# Patient Record
Sex: Male | Born: 1960 | Race: White | Hispanic: No | Marital: Single
Health system: Midwestern US, Community
[De-identification: ages and names within clinical notes are randomized; demographics above are authoritative.]

## PROBLEM LIST (undated history)

## (undated) DIAGNOSIS — I25119 Atherosclerotic heart disease of native coronary artery with unspecified angina pectoris: Secondary | ICD-10-CM

## (undated) DIAGNOSIS — E119 Type 2 diabetes mellitus without complications: Secondary | ICD-10-CM

## (undated) DIAGNOSIS — F324 Major depressive disorder, single episode, in partial remission: Secondary | ICD-10-CM

## (undated) DIAGNOSIS — I10 Essential (primary) hypertension: Secondary | ICD-10-CM

## (undated) DIAGNOSIS — Z9151 Personal history of suicidal behavior: Secondary | ICD-10-CM

## (undated) DIAGNOSIS — E785 Hyperlipidemia, unspecified: Secondary | ICD-10-CM

## (undated) DIAGNOSIS — Z915 Personal history of self-harm: Secondary | ICD-10-CM

## (undated) DIAGNOSIS — Z9189 Other specified personal risk factors, not elsewhere classified: Secondary | ICD-10-CM

## (undated) HISTORY — PX: APPENDECTOMY: SHX54

## (undated) HISTORY — PX: KNEE SURGERY: SHX244

## (undated) HISTORY — PX: TOE AMPUTATION: SHX809

## (undated) HISTORY — PX: EYE SURGERY: SHX253

## (undated) HISTORY — PX: HAMMER TOE SURGERY: SHX385

## (undated) HISTORY — PX: ANKLE SURGERY: SHX546

---

## 2014-07-14 ENCOUNTER — Emergency Department (HOSPITAL_COMMUNITY)
Admission: EM | Admit: 2014-07-14 | Discharge: 2014-07-14 | Disposition: A | Discharge status: Home / Self Care | Payer: Self-pay | Attending: Emergency Medicine | Admitting: Emergency Medicine

## 2014-07-14 ENCOUNTER — Encounter (HOSPITAL_COMMUNITY): Payer: Self-pay | Admitting: Emergency Medicine

## 2014-07-14 DIAGNOSIS — I10 Essential (primary) hypertension: Secondary | ICD-10-CM | POA: Insufficient documentation

## 2014-07-14 DIAGNOSIS — Z880 Allergy status to penicillin: Secondary | ICD-10-CM | POA: Insufficient documentation

## 2014-07-14 DIAGNOSIS — L97529 Non-pressure chronic ulcer of other part of left foot with unspecified severity: Secondary | ICD-10-CM

## 2014-07-14 DIAGNOSIS — L97519 Non-pressure chronic ulcer of other part of right foot with unspecified severity: Secondary | ICD-10-CM | POA: Insufficient documentation

## 2014-07-14 DIAGNOSIS — E11621 Type 2 diabetes mellitus with foot ulcer: Secondary | ICD-10-CM

## 2014-07-14 HISTORY — DX: Hyperlipidemia, unspecified: E78.5

## 2014-07-14 HISTORY — DX: Type 2 diabetes mellitus without complications: E11.9

## 2014-07-14 HISTORY — DX: Essential (primary) hypertension: I10

## 2014-07-14 LAB — COMPREHENSIVE METABOLIC PANEL
ALT: 26 U/L (ref 17–63)
AST: 29 U/L (ref 15–41)
Albumin: 3.7 g/dL (ref 3.5–5.0)
Alkaline Phosphatase: 68 U/L (ref 38–126)
Anion gap: 11 (ref 5–15)
BUN: 18 mg/dL (ref 6–20)
CO2: 21 mmol/L — ABNORMAL LOW (ref 22–32)
Calcium: 9.2 mg/dL (ref 8.9–10.3)
Chloride: 108 mmol/L (ref 101–111)
Creatinine, Ser: 1.08 mg/dL (ref 0.61–1.24)
GFR calc Af Amer: >60 mL/min (ref >60)
GFR calc non Af Amer: >60 mL/min (ref >60)
Glucose, Bld: 112 mg/dL — ABNORMAL HIGH (ref 70–99)
Potassium: 4.4 mmol/L (ref 3.5–5.1)
Sodium: 140 mmol/L (ref 135–145)
Total Bilirubin: 0.6 mg/dL (ref 0.3–1.2)
Total Protein: 7.3 g/dL (ref 6.5–8.1)

## 2014-07-14 LAB — CBC WITH DIFFERENTIAL/PLATELET
Basophils Absolute: 0 10*3/uL (ref 0.0–0.1)
Basophils Relative: 0 % (ref 0–1)
Eosinophils Absolute: 0.2 10*3/uL (ref 0.0–0.7)
Eosinophils Relative: 2 % (ref 0–5)
HCT: 49.1 % (ref 39.0–52.0)
Hemoglobin: 16.3 g/dL (ref 13.0–17.0)
Lymphocytes Relative: 23 % (ref 12–46)
Lymphs Abs: 1.7 10*3/uL (ref 0.7–4.0)
MCH: 29.6 pg (ref 26.0–34.0)
MCHC: 33.2 g/dL (ref 30.0–36.0)
MCV: 89.3 fL (ref 78.0–100.0)
Monocytes Absolute: 0.6 10*3/uL (ref 0.1–1.0)
Monocytes Relative: 8 % (ref 3–12)
Neutro Abs: 4.9 10*3/uL (ref 1.7–7.7)
Neutrophils Relative %: 67 % (ref 43–77)
Platelets: 170 10*3/uL (ref 150–400)
RBC: 5.5 MIL/uL (ref 4.22–5.81)
RDW: 13.5 % (ref 11.5–15.5)
WBC: 7.4 10*3/uL (ref 4.0–10.5)

## 2014-07-14 LAB — I-STAT TROPONIN, ED: Troponin i, poc: 0.01 ng/mL (ref 0.00–0.08)

## 2014-07-14 MED ORDER — DOXYCYCLINE HYCLATE 100 MG PO CAPS: 100.0000 mg | ORAL_CAPSULE | Freq: Two times a day (BID) | ORAL | Status: DC

## 2014-07-14 MED ORDER — HYDROMORPHONE HCL 1 MG/ML IJ SOLN
1.0000 mg | Freq: Once | INTRAMUSCULAR | Status: AC
Start: 2014-07-14 — End: 2014-07-14
  Administered 2014-07-14: 1 mg via INTRAMUSCULAR
  Filled 2014-07-14: qty 1

## 2014-07-14 MED ORDER — DOXYCYCLINE HYCLATE 100 MG PO TABS
100.0000 mg | ORAL_TABLET | Freq: Once | ORAL | Status: AC
  Administered 2014-07-14: 100 mg via ORAL
  Filled 2014-07-14: qty 1

## 2014-07-14 MED ORDER — OXYCODONE-ACETAMINOPHEN 5-325 MG PO TABS: 1.0000 | ORAL_TABLET | ORAL | Status: DC | PRN

## 2014-07-14 NOTE — ED Provider Notes (Signed)
CSN: [id number]     Arrival date & time 07/14/14  1932 History   First MD Initiated Contact with Patient 07/14/14 2136     Chief Complaint  Patient presents with  . Foot Ulcer    The patient said he has foot ulcers and they are draining.  The patient said [place] passing trhough from [place].  He says he is not feeling well and stopped to be seen.       (Consider location/radiation/quality/duration/timing/severity/associated sxs/prior Treatment) HPI  54 year old male presenting for evaluation of ulceration his feet. Previous of the same. Reports secondary to diabetes. Denies trauma. No fevers or chills. Does feel generally weak and fatigued though. Denies significant pain anywhere else. No shortness of breath. Denies or vomiting.   Past Medical History  Diagnosis Date  . Diabetes mellitus without complication   . Hypertension   . Hyperlipidemia    Past Surgical History  Procedure Laterality Date  . Appendectomy    . Eye surgery    . Ankle surgery    . Toe amputation    . Knee surgery    . Hammer toe surgery      both great toes   History reviewed. No pertinent family history. History  Substance Use Topics  . Smoking status: Never Smoker   . Smokeless tobacco: Never Used  . Alcohol Use: No    Review of Systems  All systems reviewed and negative, other than as noted in HPI.   Allergies  Penicillins and Keflex  Home Medications   Prior to Admission medications   Not on File   BP 155/99 mmHg  Pulse 102  Temp(Src) 98.3 F (36.8 C) (Oral)  Resp 24  Ht 6\' 5"  (1.956 m)  Wt 330 lb (149.687 kg)  BMI 39.12 kg/m2  SpO2 95% Physical Exam  Constitutional: He is oriented to person, place, and time. He appears well-developed and well-nourished. No distress.  HENT:  Head: Normocephalic and atraumatic.  Eyes: Conjunctivae are normal. Right eye exhibits no discharge. Left eye exhibits no discharge.  Neck: Neck supple.  Cardiovascular: Normal rate, regular  rhythm and normal heart sounds.  Exam reveals no gallop and no friction rub.   No murmur heard. Pulmonary/Chest: Effort normal and breath sounds normal. No respiratory distress.  Abdominal: Soft. He exhibits no distension. There is no tenderness.  Musculoskeletal:  Ulceration to the medial/distal aspect of right big toe. Superficial. Does not track deeper with probing. No drainage.  Neurological: He is alert and oriented to person, place, and time. No cranial nerve deficit. He exhibits normal muscle tone. Coordination normal.  Skin: Skin is warm and dry.  Psychiatric: He has a normal mood and affect. His behavior is normal. Thought content normal.  Nursing note and vitals reviewed.   ED Course  Procedures (including critical care time) Labs Review Labs Reviewed  COMPREHENSIVE METABOLIC PANEL - Abnormal; Notable for the following:    CO2 21 (*)    Glucose, Bld 112 (*)    All other components within normal limits  CBC WITH DIFFERENTIAL/PLATELET  I-STAT TROPOININ, ED    Imaging Review No results found.   EKG Interpretation None      MDM   Final diagnoses:  Diabetic ulcer of both feet associated with type 2 diabetes mellitus    Superficial ulceration to right great toe. Clinically doubt osteomyelitis. Afebrile. Nontoxic. Continued wound care was discussed. Will place on antibiotics.    Raeford RazorStephen Taniah Reinecke, MD 07/21/14 1322

## 2014-07-14 NOTE — ED Notes (Signed)
The patient said he has foot ulcers and they are draining.  The patient said he is from [place] just passing trhough [place].  He says he is not feeling well and stopped to be seen.    He denies any chest pain, or SOB.  He did say he is sweaty and just not feeling right.

## 2014-07-14 NOTE — Discharge Instructions (Signed)
Diabetes and Foot Care °Diabetes may cause you to have problems because of poor blood supply (circulation) to your feet and legs. This may cause the skin on your feet to become thinner, break easier, and heal more slowly. Your skin may become dry, and the skin may peel and crack. You may also have nerve damage in your legs and feet causing decreased feeling in them. You may not notice minor injuries to your feet that could lead to infections or more serious problems. Taking care of your feet is one of the most important things you can do for yourself.  °HOME CARE INSTRUCTIONS °· Wear shoes at all times, even in the house. Do not go barefoot. Bare feet are easily injured. °· Check your feet daily for blisters, cuts, and redness. If you cannot see the bottom of your feet, use a mirror or ask someone for help. °· Wash your feet with warm water (do not use hot water) and mild soap. Then pat your feet and the areas between your toes until they are completely dry. Do not soak your feet as this can dry your skin. °· Apply a moisturizing lotion or petroleum jelly (that does not contain alcohol and is unscented) to the skin on your feet and to dry, brittle toenails. Do not apply lotion between your toes. °· Trim your toenails straight across. Do not dig under them or around the cuticle. File the edges of your nails with an emery board or nail file. °· Do not cut corns or calluses or try to remove them with medicine. °· Wear clean socks or stockings every day. Make sure they are not too tight. Do not wear knee-high stockings since they may decrease blood flow to your legs. °· Wear shoes that fit properly and have enough cushioning. To break in new shoes, wear them for just a few hours a day. This prevents you from injuring your feet. Always look in your shoes before you put them on to be sure there are no objects inside. °· Do not cross your legs. This may decrease the blood flow to your feet. °· If you find a minor scrape,  cut, or break in the skin on your feet, keep it and the skin around it clean and dry. These areas may be cleansed with mild soap and water. Do not cleanse the area with peroxide, alcohol, or iodine. °· When you remove an adhesive bandage, be sure not to damage the skin around it. °· If you have a wound, look at it several times a day to make sure it is healing. °· Do not use heating pads or hot water bottles. They may burn your skin. If you have lost feeling in your feet or legs, you may not know it is happening until it is too late. °· Make sure your health care provider performs a complete foot exam at least annually or more often if you have foot problems. Report any cuts, sores, or bruises to your health care provider immediately. °SEEK MEDICAL CARE IF:  °· You have an injury that is not healing. °· You have cuts or breaks in the skin. °· You have an ingrown nail. °· You notice redness on your legs or feet. °· You feel burning or tingling in your legs or feet. °· You have pain or cramps in your legs and feet. °· Your legs or feet are numb. °· Your feet always feel cold. °SEEK IMMEDIATE MEDICAL CARE IF:  °· There is increasing redness,   swelling, or pain in or around a wound. °· There is a red line that goes up your leg. °· Pus is coming from a wound. °· You develop a fever or as directed by your health care provider. °· You notice a bad smell coming from an ulcer or wound. °Document Released: 02/17/2000 Document Revised: 10/22/2012 Document Reviewed: 07/29/2012 °ExitCare® Patient Information ©2015 [employer]. This information is not intended to replace advice given to you by your health care provider. Make sure you discuss any questions you have with your health care provider. ° °Skin Ulcer °A skin ulcer is an open sore that can be shallow or deep. Skin ulcers sometimes become infected and are difficult to treat. It may be 1 month or longer before real healing progress is made. °CAUSES  °· Injury. °· Problems  with the veins or arteries. °· Diabetes. °· Insect bites. °· Bedsores. °· Inflammatory conditions. °SYMPTOMS  °· Pain, redness, swelling, and tenderness around the ulcer. °· Fever. °· Bleeding from the ulcer. °· Yellow or clear fluid coming from the ulcer. °DIAGNOSIS  °There are many types of skin ulcers. Any open sores will be examined. Certain tests will be done to determine the kind of ulcer you have. The right treatment depends on the type of ulcer you have. °TREATMENT  °Treatment is a long-term challenge. It may include: °· Wearing an elastic wrap, compression stockings, or gel cast over the ulcer area. °· Taking antibiotic medicines or putting antibiotic creams on the affected area if there is an infection. °HOME CARE INSTRUCTIONS °· Put on your bandages (dressings), wraps, or casts over the ulcer as directed by your caregiver. °· Change all dressings as directed by your caregiver. °· Take all medicines as directed by your caregiver. °· Keep the affected area clean and dry. °· Avoid injuries to the affected area. °· Eat a well-balanced, healthy diet that includes plenty of fruit and vegetables. °· If you smoke, consider quitting or decreasing the amount of cigarettes you smoke. °· Once the ulcer heals, get regular exercise as directed by your caregiver. °· Work with your caregiver to make sure your blood pressure, cholesterol, and diabetes are well-controlled. °· Keep your skin moisturized. Dry skin can crack and lead to skin ulcers. °SEEK IMMEDIATE MEDICAL CARE IF:  °· Your pain gets worse. °· You have swelling, redness, or fluids around the ulcer. °· You have chills. °· You have a fever. °MAKE SURE YOU:  °· Understand these instructions. °· Will watch your condition. °· Will get help right away if you are not doing well or get worse. °Document Released: 03/29/2004 Document Revised: 05/14/2011 Document Reviewed: 10/06/2010 °ExitCare® Patient Information ©2015 [employer]. This information is not intended to  replace advice given to you by your health care provider. Make sure you discuss any questions you have with your health care provider. ° °

## 2015-05-09 ENCOUNTER — Emergency Department: Admit: 2015-05-10 | Payer: PRIVATE HEALTH INSURANCE

## 2015-05-09 DIAGNOSIS — R079 Chest pain, unspecified: Secondary | ICD-10-CM

## 2015-05-09 NOTE — ED Provider Notes (Signed)
HPI Comments: Patient states that he was driving back home from [place] to [place] around 8:30 PM when he started getting left sided chest pressure radiating to his left shoulder.  It was gradual in onset and progressed to 6/10.  He had some associated  Shortness of breath and diaphoresis.  His symptoms are persisting without any obvious aggravating or alleviating factors.  He denies similar pain in the past and has not taken any medicine for his symptoms.  He denies any cardiac problems in the past, has never had a stress test or cardiac catheterization.  He is a diabetic and a hypertensive and takes medications for both.    Elements of this note were made using speech recognition software.  As such, errors of speech recognition may occur.    Patient is a 55 y.o. male presenting with chest pain. The history is provided by the patient.   Chest Pain (Angina)    Associated symptoms include shortness of breath. Pertinent negatives include no fever, no nausea, no palpitations and no vomiting.        Past Medical History:   Diagnosis Date   ??? Hypertension        Past Surgical History:   Procedure Laterality Date   ??? HX APPENDECTOMY     ??? HX HEENT      right eye   ??? HX ORTHOPAEDIC      right big toe. and left small toes amputated, , seveal other foot surgert         No family history on file.    Social History     Social History   ??? Marital status: SINGLE     Spouse name: N/A   ??? Number of children: N/A   ??? Years of education: N/A     Occupational History   ??? Not on file.     Social History Main Topics   ??? Smoking status: Never Smoker   ??? Smokeless tobacco: Not on file   ??? Alcohol use No   ??? Drug use: Not on file   ??? Sexual activity: Not on file     Other Topics Concern   ??? Not on file     Social History Narrative   ??? No narrative on file         ALLERGIES: Bactrim [sulfamethoprim ds]; Keflex [cephalexin]; and Pcn [penicillins]    Review of Systems   Constitutional: Negative for chills and fever.    Respiratory: Positive for shortness of breath.    Cardiovascular: Positive for chest pain. Negative for palpitations.   Gastrointestinal: Negative for nausea and vomiting.   All other systems reviewed and are negative.      Vitals:    05/09/15 2255   BP: (!) 149/92   Pulse: 92   Resp: 18   Temp: 98.3 ??F (36.8 ??C)   SpO2: 99%   Weight: 158.8 kg (350 lb)   Height: 6\' 3"  (1.905 m)            Physical Exam   Constitutional: He is oriented to person, place, and time. He appears well-developed and well-nourished.   HENT:   Head: Normocephalic and atraumatic.   Eyes: Conjunctivae are normal. Pupils are equal, round, and reactive to light.   Neck: Normal range of motion. Neck supple.   Cardiovascular: Normal rate and regular rhythm.    Pulmonary/Chest: Effort normal and breath sounds normal.   Abdominal: Soft. Bowel sounds are normal.   Musculoskeletal: He exhibits edema. He  exhibits no tenderness.   Trace edema bilateral lower extremities   Neurological: He is alert and oriented to person, place, and time.   Skin: Skin is warm and dry.   Psychiatric: He has a normal mood and affect. His behavior is normal.   Nursing note and vitals reviewed.       MDM  Number of Diagnoses or Management Options  Diagnosis management comments: Differential diagnoses: Acute coronary syndrome, esophageal spasm, GERD  12:36 AM Pain decreased to 4/10 after first NTG  12:46 AM Pain now 2/10 after second NTG  1:06 AM minor improvement after the third nitroglycerin  1:24 AM spoke with Dr. Neale Burly, discussed results and details of case.  The patient will be admitted for continued [place] at the downtown facility       Amount and/or Complexity of Data Reviewed  Clinical lab tests: ordered and reviewed  Tests in the radiology section of CPT??: ordered and reviewed  Tests in the medicine section of CPT??: ordered and reviewed  Discuss the patient with other providers: yes  Independent visualization of images, tracings, or specimens: yes     Risk of Complications, Morbidity, and/or Mortality  Presenting problems: high  Diagnostic procedures: moderate  Management options: high    Patient Progress  Patient progress: improved    ED Course       Procedures

## 2015-05-10 ENCOUNTER — Inpatient Hospital Stay
Admit: 2015-05-10 | Discharge: 2015-05-10 | Disposition: A | Discharge status: Other Acute Inpatient Hospital | Payer: PRIVATE HEALTH INSURANCE | Attending: Emergency Medicine

## 2015-05-10 ENCOUNTER — Inpatient Hospital Stay: Payer: PRIVATE HEALTH INSURANCE

## 2015-05-10 LAB — D-DIMER, QUANTITATIVE: D-Dimer, Quant: 0.8 ug/ml(FEU) (ref <0.55)

## 2015-05-10 LAB — LIPID PANEL
CHOL/HDL Ratio: 4.5
Cholesterol, total: 176 MG/DL (ref <200)
HDL Cholesterol: 39 MG/DL — ABNORMAL LOW (ref 40–60)
LDL, calculated: 113 MG/DL — ABNORMAL HIGH (ref <100)
Triglyceride: 120 MG/DL (ref 35–150)
VLDL, calculated: 24 MG/DL — ABNORMAL HIGH (ref 6.0–23.0)

## 2015-05-10 LAB — CBC WITH AUTOMATED DIFF
ABS. BASOPHILS: 0 10*3/uL (ref 0.0–0.2)
ABS. EOSINOPHILS: 0.1 10*3/uL (ref 0.0–0.8)
ABS. IMM. GRANS.: 0.1 10*3/uL (ref 0.0–0.5)
ABS. LYMPHOCYTES: 2.7 10*3/uL (ref 0.5–4.6)
ABS. MONOCYTES: 0.9 10*3/uL (ref 0.1–1.3)
ABS. NEUTROPHILS: 4 10*3/uL (ref 1.7–8.2)
BASOPHILS: 0 % (ref 0.0–2.0)
EOSINOPHILS: 1 % (ref 0.5–7.8)
HCT: 55.2 % — ABNORMAL HIGH (ref 41.1–50.3)
HGB: 18.9 g/dL — ABNORMAL HIGH (ref 13.6–17.2)
IMMATURE GRANULOCYTES: 1 % (ref 0.0–5.0)
LYMPHOCYTES: 35 % (ref 13–44)
MCH: 31.1 PG (ref 26.1–32.9)
MCHC: 34.2 g/dL (ref 31.4–35.0)
MCV: 90.9 FL (ref 79.6–97.8)
MONOCYTES: 12 % (ref 4.0–12.0)
MPV: 12.1 FL (ref 10.8–14.1)
NEUTROPHILS: 51 % (ref 43–78)
PLATELET: 187 10*3/uL (ref 150–450)
RBC: 6.07 M/uL — ABNORMAL HIGH (ref 4.23–5.67)
RDW: 13.4 % (ref 11.9–14.6)
WBC: 7.9 10*3/uL (ref 4.3–11.1)

## 2015-05-10 LAB — METABOLIC PANEL, COMPREHENSIVE
A-G Ratio: 0.9 — ABNORMAL LOW (ref 1.2–3.5)
ALT (SGPT): 49 U/L (ref 12–65)
AST (SGOT): 34 U/L (ref 15–37)
Albumin: 3.7 g/dL (ref 3.5–5.0)
Alk. phosphatase: 80 U/L (ref 50–136)
Anion gap: 12 mmol/L (ref 7–16)
BUN: 19 MG/DL (ref 6–23)
Bilirubin, total: 0.8 MG/DL (ref 0.2–1.1)
CO2: 24 mmol/L (ref 21–32)
Calcium: 9.1 MG/DL (ref 8.3–10.4)
Chloride: 97 mmol/L — ABNORMAL LOW (ref 98–107)
Creatinine: 1.08 MG/DL (ref 0.8–1.5)
GFR est AA: >60 mL/min/{1.73_m2} (ref >60)
GFR est non-AA: >60 mL/min/{1.73_m2} (ref >60)
Globulin: 4.2 g/dL — ABNORMAL HIGH (ref 2.3–3.5)
Glucose: 239 mg/dL — ABNORMAL HIGH (ref 65–100)
Potassium: 4 mmol/L (ref 3.5–5.1)
Protein, total: 7.9 g/dL (ref 6.3–8.2)
Sodium: 133 mmol/L — ABNORMAL LOW (ref 136–145)

## 2015-05-10 LAB — CBC W/O DIFF
HCT: 52.1 % — ABNORMAL HIGH (ref 41.1–50.3)
HGB: 17.8 g/dL — ABNORMAL HIGH (ref 13.6–17.2)
MCH: 31 PG (ref 26.1–32.9)
MCHC: 34.2 g/dL (ref 31.4–35.0)
MCV: 90.6 FL (ref 79.6–97.8)
MPV: 11.8 FL (ref 10.8–14.1)
PLATELET: 167 10*3/uL (ref 150–450)
RBC: 5.75 M/uL — ABNORMAL HIGH (ref 4.23–5.67)
RDW: 13.3 % (ref 11.9–14.6)
WBC: 5.7 10*3/uL (ref 4.3–11.1)

## 2015-05-10 LAB — EKG, 12 LEAD, INITIAL
Atrial Rate: 100 {beats}/min
Calculated P Axis: 33 degrees
Calculated R Axis: -23 degrees
Calculated T Axis: 49 degrees
P-R Interval: 262 ms
Q-T Interval: 322 ms
QRS Duration: 90 ms
QTC Calculation (Bezet): 415 ms
Ventricular Rate: 100 {beats}/min

## 2015-05-10 LAB — METABOLIC PANEL, BASIC
Anion gap: 12 mmol/L (ref 7–16)
BUN: 20 MG/DL (ref 6–23)
CO2: 25 mmol/L (ref 21–32)
Calcium: 8.9 MG/DL (ref 8.3–10.4)
Chloride: 99 mmol/L (ref 98–107)
Creatinine: 1 MG/DL (ref 0.8–1.5)
GFR est AA: >60 mL/min/{1.73_m2} (ref >60)
GFR est non-AA: >60 mL/min/{1.73_m2} (ref >60)
Glucose: 259 mg/dL — ABNORMAL HIGH (ref 65–100)
Potassium: 3.9 mmol/L (ref 3.5–5.1)
Sodium: 136 mmol/L (ref 136–145)

## 2015-05-10 LAB — POC TROPONIN
Troponin-I (POC): 0.01 ng/ml (ref 0.0–0.08)
Troponin-I (POC): 0.01 ng/ml (ref 0.0–0.08)

## 2015-05-10 LAB — GLUCOSE, POC
Glucose (POC): 271 mg/dL — ABNORMAL HIGH (ref 65–100)
Glucose (POC): 245 mg/dL — ABNORMAL HIGH (ref 65–100)
Glucose (POC): 296 mg/dL — ABNORMAL HIGH (ref 65–100)

## 2015-05-10 LAB — TROPONIN I: Troponin-I, Qt.: <0.02 NG/ML — ABNORMAL LOW (ref 0.02–0.05)

## 2015-05-10 LAB — D DIMER: D DIMER: 0.8 ug/ml(FEU) — CR (ref <0.55)

## 2015-05-10 MED ORDER — MIDAZOLAM 1 MG/ML IJ SOLN
1 mg/mL | INTRAMUSCULAR | Status: DC | PRN
Start: 2015-05-10 — End: 2015-05-11
  Administered 2015-05-10 (×2): via INTRAVENOUS

## 2015-05-10 MED ORDER — NITROGLYCERIN 0.2MG/ML SYRINGE
0.2 mg/mL | INTRAMUSCULAR | Status: AC
Start: 2015-05-10 — End: ?

## 2015-05-10 MED ORDER — ASPIRIN 81 MG CHEWABLE TAB
81 mg | Freq: Every day | ORAL | Status: DC
Start: 2015-05-10 — End: 2015-05-11
  Administered 2015-05-10 – 2015-05-11 (×3): via ORAL

## 2015-05-10 MED ORDER — NITROGLYCERIN 0.4 MG SUBLINGUAL TAB
0.4 mg | SUBLINGUAL | Status: DC | PRN
Start: 2015-05-10 — End: 2015-05-10
  Administered 2015-05-10 (×2): via SUBLINGUAL

## 2015-05-10 MED ORDER — VERAPAMIL 2.5 MG/ML IV
2.5 mg/mL | Freq: Once | INTRAVENOUS | Status: AC
Start: 2015-05-10 — End: 2015-05-10
  Administered 2015-05-10: 14:00:00 via INTRA_ARTERIAL

## 2015-05-10 MED ORDER — SODIUM CHLORIDE 0.9 % INJECTION
1.1 mg/mL | INTRAMUSCULAR | Status: AC | PRN
Start: 2015-05-10 — End: 2015-05-10
  Administered 2015-05-10: 19:00:00 via INTRAVENOUS

## 2015-05-10 MED ORDER — METOPROLOL SUCCINATE SR 50 MG 24 HR TAB
50 mg | ORAL_TABLET | Freq: Every day | ORAL | 11 refills | Status: AC
Start: 2015-05-10 — End: ?

## 2015-05-10 MED ORDER — MORPHINE 2 MG/ML INJECTION
2 mg/mL | INTRAMUSCULAR | Status: DC | PRN
Start: 2015-05-10 — End: 2015-05-11

## 2015-05-10 MED ORDER — ONDANSETRON (PF) 4 MG/2 ML INJECTION
4 mg/2 mL | INTRAMUSCULAR | Status: DC | PRN
Start: 2015-05-10 — End: 2015-05-11

## 2015-05-10 MED ORDER — MIDAZOLAM 1 MG/ML IJ SOLN
1 mg/mL | INTRAMUSCULAR | Status: AC
Start: 2015-05-10 — End: ?

## 2015-05-10 MED ORDER — ACETAMINOPHEN 325 MG TABLET
325 mg | ORAL | Status: DC | PRN
Start: 2015-05-10 — End: 2015-05-11

## 2015-05-10 MED ORDER — METOPROLOL TARTRATE 25 MG TAB
25 mg | Freq: Two times a day (BID) | ORAL | Status: DC
Start: 2015-05-10 — End: 2015-05-11
  Administered 2015-05-10 – 2015-05-11 (×4): via ORAL

## 2015-05-10 MED ORDER — VERAPAMIL 2.5 MG/ML IV
2.5 mg/mL | INTRAVENOUS | Status: AC
Start: 2015-05-10 — End: ?

## 2015-05-10 MED ORDER — SODIUM CHLORIDE 0.9 % IJ SYRG
INTRAMUSCULAR | Status: DC | PRN
Start: 2015-05-10 — End: 2015-05-11

## 2015-05-10 MED ORDER — NYSTATIN 100,000 UNIT/G TOPICAL POWDER
100000 unit/gram | Freq: Two times a day (BID) | CUTANEOUS | Status: DC
Start: 2015-05-10 — End: 2015-05-11
  Administered 2015-05-10 – 2015-05-11 (×2): via TOPICAL

## 2015-05-10 MED ORDER — IOPAMIDOL 76 % IV SOLN
370 mg iodine /mL (76 %) | Freq: Once | INTRAVENOUS | Status: AC
Start: 2015-05-10 — End: 2015-05-10
  Administered 2015-05-10: 15:00:00 via INTRAVENOUS

## 2015-05-10 MED ORDER — SODIUM CHLORIDE 0.9 % IJ SYRG
Freq: Three times a day (TID) | INTRAMUSCULAR | Status: DC
Start: 2015-05-10 — End: 2015-05-11
  Administered 2015-05-10 – 2015-05-11 (×4): via INTRAVENOUS

## 2015-05-10 MED ORDER — SIMVASTATIN 20 MG TAB
20 mg | Freq: Every evening | ORAL | Status: DC
Start: 2015-05-10 — End: 2015-05-11
  Administered 2015-05-10 – 2015-05-11 (×2): via ORAL

## 2015-05-10 MED ORDER — GLIPIZIDE 5 MG TAB
5 mg | Freq: Every day | ORAL | Status: DC
Start: 2015-05-10 — End: 2015-05-11
  Administered 2015-05-11: 12:00:00 via ORAL

## 2015-05-10 MED ORDER — HEPARIN (PORCINE) IN NS (PF) 2,000 UNIT/1,000 ML IV
2000 unit/1,000 mL | INTRAVENOUS | Status: DC
Start: 2015-05-10 — End: 2015-05-11
  Administered 2015-05-10: 14:00:00 via INTRAVENOUS

## 2015-05-10 MED ORDER — GLIPIZIDE 5 MG TAB
5 mg | Freq: Every day | ORAL | Status: DC
Start: 2015-05-10 — End: 2015-05-10

## 2015-05-10 MED ORDER — METOPROLOL SUCCINATE SR 25 MG 24 HR TAB
25 mg | ORAL_TABLET | Freq: Every day | ORAL | 11 refills | Status: DC
Start: 2015-05-10 — End: 2015-05-10

## 2015-05-10 MED ORDER — IOPAMIDOL 76 % IV SOLN
370 mg iodine /mL (76 %) | INTRAVENOUS | Status: AC
Start: 2015-05-10 — End: ?

## 2015-05-10 MED ORDER — NITROGLYCERIN 0.4 MG SUBLINGUAL TAB
0.4 mg | SUBLINGUAL | Status: DC | PRN
Start: 2015-05-10 — End: 2015-05-11

## 2015-05-10 MED ORDER — LIDOCAINE HCL 2 % (20 MG/ML) IJ SOLN
20 mg/mL (2 %) | INTRAMUSCULAR | Status: DC | PRN
Start: 2015-05-10 — End: 2015-05-11
  Administered 2015-05-10: 14:00:00 via INTRADERMAL

## 2015-05-10 MED ORDER — HEPARIN (PORCINE) IN NS (PF) 2,000 UNIT/1,000 ML IV
2000 unit/1,000 mL | INTRAVENOUS | Status: AC
Start: 2015-05-10 — End: ?

## 2015-05-10 MED ORDER — ASPIRIN 81 MG CHEWABLE TAB
81 mg | ORAL | Status: AC
Start: 2015-05-10 — End: 2015-05-10
  Administered 2015-05-10: 05:00:00 via ORAL

## 2015-05-10 MED ORDER — LISINOPRIL-HYDROCHLOROTHIAZIDE 20 MG-25 MG TAB
20-25 mg | Freq: Every day | ORAL | Status: DC
Start: 2015-05-10 — End: 2015-05-11
  Administered 2015-05-11 (×2): via ORAL

## 2015-05-10 MED ORDER — LIDOCAINE HCL 2 % (20 MG/ML) IJ SOLN
20 mg/mL (2 %) | INTRAMUSCULAR | Status: AC
Start: 2015-05-10 — End: ?

## 2015-05-10 MED ORDER — ASPIRIN 81 MG CHEWABLE TAB
81 mg | ORAL | Status: AC
Start: 2015-05-10 — End: ?

## 2015-05-10 MED ORDER — NYSTATIN 100,000 UNIT/G TOPICAL POWDER
100000 unit/gram | Freq: Two times a day (BID) | CUTANEOUS | Status: DC
Start: 2015-05-10 — End: 2015-05-10

## 2015-05-10 MED ORDER — HYDROCODONE-ACETAMINOPHEN 5 MG-325 MG TAB
5-325 mg | ORAL | Status: DC | PRN
Start: 2015-05-10 — End: 2015-05-11

## 2015-05-10 MED ORDER — HEPARIN (PORCINE) 10,000 UNIT/ML IJ SOLN
10000 unit/mL | INTRAMUSCULAR | Status: AC
Start: 2015-05-10 — End: ?

## 2015-05-10 MED ORDER — INSULIN LISPRO 100 UNIT/ML INJECTION
100 unit/mL | Freq: Four times a day (QID) | SUBCUTANEOUS | Status: DC
Start: 2015-05-10 — End: 2015-05-11
  Administered 2015-05-10 – 2015-05-11 (×4): via SUBCUTANEOUS

## 2015-05-10 MED ORDER — NITROGLYCERIN 2 % TRANSDERMAL OINTMENT
2 % | Freq: Four times a day (QID) | TRANSDERMAL | Status: DC
Start: 2015-05-10 — End: 2015-05-10
  Administered 2015-05-10: 09:00:00 via TOPICAL

## 2015-05-10 MED ORDER — SODIUM CHLORIDE 0.9 % IV
INTRAVENOUS | Status: DC
Start: 2015-05-10 — End: 2015-05-11
  Administered 2015-05-10: 09:00:00 via INTRAVENOUS

## 2015-05-10 MED FILL — HEPARIN (PORCINE) 10,000 UNIT/ML IJ SOLN: 10000 unit/mL | INTRAMUSCULAR | Qty: 1

## 2015-05-10 MED FILL — NYSTATIN 100,000 UNIT/G TOPICAL POWDER: 100000 unit/gram | CUTANEOUS | Qty: 15

## 2015-05-10 MED FILL — XYLOCAINE 20 MG/ML (2 %) INJECTION SOLUTION: 20 mg/mL (2 %) | INTRAMUSCULAR | Qty: 20

## 2015-05-10 MED FILL — METOPROLOL TARTRATE 25 MG TAB: 25 mg | ORAL | Qty: 1

## 2015-05-10 MED FILL — ASPIRIN 81 MG CHEWABLE TAB: 81 mg | ORAL | Qty: 4

## 2015-05-10 MED FILL — DEFINITY 1.1 MG/ML INTRAVENOUS SUSPENSION: 1.1 mg/mL | INTRAVENOUS | Qty: 1.3

## 2015-05-10 MED FILL — NITRO-BID 2 % TRANSDERMAL OINTMENT: 2 % | TRANSDERMAL | Qty: 1

## 2015-05-10 MED FILL — SIMVASTATIN 20 MG TAB: 20 mg | ORAL | Qty: 1

## 2015-05-10 MED FILL — ASPIRIN 81 MG CHEWABLE TAB: 81 mg | ORAL | Qty: 1

## 2015-05-10 MED FILL — MIDAZOLAM 1 MG/ML IJ SOLN: 1 mg/mL | INTRAMUSCULAR | Qty: 2

## 2015-05-10 MED FILL — VERAPAMIL 2.5 MG/ML IV: 2.5 mg/mL | INTRAVENOUS | Qty: 2

## 2015-05-10 MED FILL — NITROSTAT 0.4 MG SUBLINGUAL TABLET: 0.4 mg | SUBLINGUAL | Qty: 1

## 2015-05-10 MED FILL — SODIUM CHLORIDE 0.9 % IV: INTRAVENOUS | Qty: 1000

## 2015-05-10 MED FILL — ISOVUE-370  76 % INTRAVENOUS SOLUTION: 370 mg iodine /mL (76 %) | INTRAVENOUS | Qty: 300

## 2015-05-10 MED FILL — HEPARIN (PORCINE) IN NS (PF) 2,000 UNIT/1,000 ML IV: 2000 unit/1,000 mL | INTRAVENOUS | Qty: 1000

## 2015-05-10 MED FILL — NITROGLYCERIN 0.2MG/ML SYRINGE: 0.2 mg/mL | INTRAMUSCULAR | Qty: 6

## 2015-05-10 NOTE — Progress Notes (Signed)
Bedside shift report from Bryan Ruehmer, RN.

## 2015-05-10 NOTE — Procedures (Signed)
Brief Cardiac Procedure Note    Patient: Steven Farmer MRN: [id number]  SSN: [id number]    Date of Birth: 07/14/1960  Age: 54 y.o.  Sex: male      Date of Procedure: 05/10/2015     Pre-procedure Diagnosis: Chest pain CCS Class IV    Post-procedure Diagnosis: Coronary Artery Disease    Procedure: coronary angio    Brief Description of Procedure: via rra    Performed By: Cynthia Stainback W Jayelyn Barno, MD     Assistants:     Anesthesia: Moderate Sedation    Estimated Blood Loss: Less than 10 mL      Specimens: None    Implants: None    Findings:   Cors diffuse, no obstruction    Complications: None    Recommendations: Continue medical therapy.    Signed By: Jinna Weinman W Emersen Carroll, MD     May 10, 2015

## 2015-05-10 NOTE — Progress Notes (Signed)
Patient transported to cath lab via bed.

## 2015-05-10 NOTE — Discharge Summary (Addendum)
[facility] Cardiology Discharge Summary     Patient ID:  Steven Farmer  [id number]  54 y.o.  1960/07/17    Admit date: 05/10/2015    Discharge date:  05/10/2015    Admitting Physician: Herbert Spires, MD     Discharge Physician: Tennis Ship, NP/[facility]. [facility]    Admission Diagnoses: Chest Pain  Chest pain    Discharge Diagnoses:    Diagnosis   ??? Chest pain   ??? Hypertension   ??? Dyslipidemia   ??? Type II diabetes mellitus ([place])   ??? Morbid obesity [facility])       Cardiology Procedures this admission:  Diagnostic left heart catheterization  Consults: None    Hospital Course: Patient presented to the ER with c/o chest pain with associated SOB and diaphoresis.  EKG showed SR.  After 3 nitroglycerin and ASA patient was pain free.  Patient underwent cardiac catheterization by Dr. Jolaine Click. Patient was found to have nonobstructive CAD. Patient tolerated the procedure well and was taken to the telemetry floor for recovery. D dimer was 0.8, no CT of chest as not felt d dimer elevation secondary to PE. The afternoon of discharge, patient was up feeling well without any complaints of chest pain or shortness of breath. Patient's right radial cath site was clean, dry and intact without hematoma or bruit. Patient's labs were WNL. Patient was seen and examined by Dr. Jolaine Click and determined stable and ready for discharge. The patient will follow up with cardiology in [place].     DISPOSITION: The patient is being discharged home in stable condition on a low saturated fat, low cholesterol and low salt diet. The patient is instructed to advance activities as tolerated to the limit of fatigue or shortness of breath. The patient is instructed to avoid all heavy lifting, straining, stooping or squatting for 3-5 days. The patient is instructed to watch the cath site for bleeding/oozing; if seen, the patient is instructed to apply firm pressure with a clean cloth and call [facility]  Cardiology at [phone]. The patient is instructed to watch for signs of infection which include: increasing area of redness, fever/hot to touch or purulent drainage at the catheterization site. The patient is instructed not to soak in a bathtub for 7-10 days, but is cleared to shower. The patient is instructed to call the office or return to the ER for immediate evaluation for any shortness of breath or chest pain not relieved by NTG.        Discharge Exam:   Visit Vitals   ??? BP 118/67 (BP 1 Location: Left arm, BP Patient Position: At rest)   ??? Pulse 67   ??? Temp 98.5 ??F (36.9 ??C)   ??? Resp 18   ??? Ht _0  (1.905 m)   ??? Wt 152.1 kg (335 lb 6.4 oz)   ??? SpO2 96%   ??? BMI 41.92 kg/m2     Patient has been seen by Dr. Jolaine Click: see his progress note for exam details.    Recent Results (from the past 24 hour(s))   EKG, 12 LEAD, INITIAL    Collection Time: 05/09/15 10:36 PM   Result Value Ref Range    Systolic BP  mmHg    Diastolic BP  mmHg    Ventricular Rate 100 BPM    Atrial Rate 100 BPM    P-R Interval 262 ms    QRS Duration 90 ms    Q-T Interval 322 ms    QTC Calculation (Bezet) 415 ms  Calculated P Axis 33 degrees    Calculated R Axis -23 degrees    Calculated T Axis 49 degrees    Diagnosis       !! AGE AND GENDER SPECIFIC ECG ANALYSIS !!  Sinus rhythm with 1st degree A-V block  Possible Left atrial enlargement  Left ventricular hypertrophy  Nonspecific ST abnormality  Abnormal ECG  No previous ECGs available  Confirmed by CEBE  MD (UC), JOHN E ([id number]) on 05/10/2015 8:53:48 AM     POC TROPONIN-I    Collection Time: 05/09/15 10:45 PM   Result Value Ref Range    Troponin-I (POC) 0.01 0.0 - 0.08 ng/ml   CBC WITH AUTOMATED DIFF    Collection Time: 05/09/15 10:46 PM   Result Value Ref Range    WBC 7.9 4.3 - 11.1 K/uL    RBC 6.07 (H) 4.23 - 5.67 M/uL    HGB 18.9 (H) 13.6 - 17.2 g/dL    HCT 55.2 (H) 41.1 - 50.3 %    MCV 90.9 79.6 - 97.8 FL    MCH 31.1 26.1 - 32.9 PG    MCHC 34.2 31.4 - 35.0 g/dL    RDW 13.4 11.9 - 14.6 %     PLATELET 187 150 - 450 K/uL    MPV 12.1 10.8 - 14.1 FL    DF AUTOMATED      NEUTROPHILS 51 43 - 78 %    LYMPHOCYTES 35 13 - 44 %    MONOCYTES 12 4.0 - 12.0 %    EOSINOPHILS 1 0.5 - 7.8 %    BASOPHILS 0 0.0 - 2.0 %    IMMATURE GRANULOCYTES 1.0 0.0 - 5.0 %    ABS. NEUTROPHILS 4.0 1.7 - 8.2 K/UL    ABS. LYMPHOCYTES 2.7 0.5 - 4.6 K/UL    ABS. MONOCYTES 0.9 0.1 - 1.3 K/UL    ABS. EOSINOPHILS 0.1 0.0 - 0.8 K/UL    ABS. BASOPHILS 0.0 0.0 - 0.2 K/UL    ABS. IMM. GRANS. 0.1 0.0 - 0.5 K/UL   METABOLIC PANEL, COMPREHENSIVE    Collection Time: 05/09/15 10:46 PM   Result Value Ref Range    Sodium 133 (L) 136 - 145 mmol/L    Potassium 4.0 3.5 - 5.1 mmol/L    Chloride 97 (L) 98 - 107 mmol/L    CO2 24 21 - 32 mmol/L    Anion gap 12 7 - 16 mmol/L    Glucose 239 (H) 65 - 100 mg/dL    BUN 19 6 - 23 MG/DL    Creatinine 1.08 0.8 - 1.5 MG/DL    GFR est AA >60 >60 ml/min/1.69m    GFR est non-AA >60 >60 ml/min/1.729m   Calcium 9.1 8.3 - 10.4 MG/DL    Bilirubin, total 0.8 0.2 - 1.1 MG/DL    ALT (SGPT) 49 12 - 65 U/L    AST (SGOT) 34 15 - 37 U/L    Alk. phosphatase 80 50 - 136 U/L    Protein, total 7.9 6.3 - 8.2 g/dL    Albumin 3.7 3.5 - 5.0 g/dL    Globulin 4.2 (H) 2.3 - 3.5 g/dL    A-G Ratio 0.9 (L) 1.2 - 3.5     POC TROPONIN-I    Collection Time: 05/10/15  1:40 AM   Result Value Ref Range    Troponin-I (POC) 0.01 0.0 - 0.08 ng/ml   LIPID PANEL    Collection Time: 05/10/15  4:15 AM   Result Value Ref Range  LIPID PROFILE          Cholesterol, total 176 <200 MG/DL    Triglyceride 120 35 - 150 MG/DL    HDL Cholesterol 39 (L) 40 - 60 MG/DL    LDL, calculated 113 (H) <100 MG/DL    VLDL, calculated 24 (H) 6.0 - 23.0 MG/DL    CHOL/HDL Ratio 4.5     TROPONIN I    Collection Time: 05/10/15  4:15 AM   Result Value Ref Range    Troponin-I, Qt. <0.02 (L) 0.02 - 7.61 NG/ML   METABOLIC PANEL, BASIC    Collection Time: 05/10/15  4:15 AM   Result Value Ref Range    Sodium 136 136 - 145 mmol/L    Potassium 3.9 3.5 - 5.1 mmol/L     Chloride 99 98 - 107 mmol/L    CO2 25 21 - 32 mmol/L    Anion gap 12 7 - 16 mmol/L    Glucose 259 (H) 65 - 100 mg/dL    BUN 20 6 - 23 MG/DL    Creatinine 1.00 0.8 - 1.5 MG/DL    GFR est AA >60 >60 ml/min/1.25m    GFR est non-AA >60 >60 ml/min/1.773m   Calcium 8.9 8.3 - 10.4 MG/DL   CBC W/O DIFF    Collection Time: 05/10/15  4:15 AM   Result Value Ref Range    WBC 5.7 4.3 - 11.1 K/uL    RBC 5.75 (H) 4.23 - 5.67 M/uL    HGB 17.8 (H) 13.6 - 17.2 g/dL    HCT 52.1 (H) 41.1 - 50.3 %    MCV 90.6 79.6 - 97.8 FL    MCH 31.0 26.1 - 32.9 PG    MCHC 34.2 31.4 - 35.0 g/dL    RDW 13.3 11.9 - 14.6 %    PLATELET 167 150 - 450 K/uL    MPV 11.8 10.8 - 14.1 FL   GLUCOSE, POC    Collection Time: 05/10/15  6:17 AM   Result Value Ref Range    Glucose (POC) 271 (H) 65 - 100 mg/dL   D DIMER    Collection Time: 05/10/15 11:59 AM   Result Value Ref Range    D DIMER 0.80 (HH) <0.55 ug/ml(FEU)   GLUCOSE, POC    Collection Time: 05/10/15 12:30 PM   Result Value Ref Range    Glucose (POC) 245 (H) 65 - 100 mg/dL         Patient Instructions:     Current Discharge Medication List      START taking these medications    Details   aspirin 81 mg chewable tablet Take 1 Tab by mouth daily.      metoprolol succinate (TOPROL-XL) 50 mg XL tablet Take 1 Tab by mouth daily.  Qty: 30 Tab, Refills: 11         CONTINUE these medications which have NOT CHANGED    Details   glipiZIDE (GLUCOTROL) 5 mg tablet Take 2.5 mg by mouth daily.      lisinopril-hydroCHLOROthiazide (PRINZIDE, ZESTORETIC) 20-25 mg per tablet Take  by mouth daily.      simvastatin (ZOCOR) 20 mg tablet Take  by mouth nightly.             Signed:  StTennis ShipNP  05/10/2015  1:26 PM

## 2015-05-10 NOTE — ED Notes (Signed)
Pt[facility]  [facility] for further evaluation

## 2015-05-10 NOTE — Progress Notes (Signed)
TRANSFER - IN REPORT:    Verbal report received from [place] EagleMarty, RN(name) on [employer] Inc[employer]ane Burgard  being received from Cath lab(unit) for routine post - op      Report consisted of patient???s Situation, Background, Assessment and   Recommendations(SBAR).     Information from the following report(s) SBAR, Kardex, Procedure Summary, MAR and Recent Results was reviewed with the receiving nurse.    Opportunity for questions and clarification was provided.      Assessment completed upon patient???s arrival to unit and care assumed.

## 2015-05-10 NOTE — H&P (Signed)
[facility] History & Physical      Date of  Admission: 05/10/2015  2:52 AM     Primary Care Physician: Unknown  Primary Cardiologist: None  Admitting Physician: Dr. Jolaine Click     CC: chest pain    HPI:  Steven Farmer is a 55 y.o. male with past medical history of HTN, DM2, dyslipidemia, and morbid obesity who presented to the ER at [facility] with complaints of chest pain with associated shortness of breath and diaphoresis. Patient is traveling from [place] back to [place] and developed 6/10 chest pain while driving. He states that it was located in mid sternal to left chest with radiation into his left shoulder. Upon arrival to the ER, serial cardiac enzymes were negative. EKG showed SR without acute ST/T wave changes. Patient was transferred to [facility] and admitted to telemetry observation for further evaluation. Upon arrival to the telemetry unit, patient continued to complain of mild chest pain. He states that after receiving 3 SL nitros while in the ER, he was chest pain free. He was also given 355m ASA while in the ER.     Family history -- + for CAD with father having MI at 862 Social history -- denies tobacco abuse     Past Medical History:   Diagnosis Date   ??? Hypertension       Past Surgical History:   Procedure Laterality Date   ??? HX APPENDECTOMY     ??? HX HEENT      right eye   ??? HX ORTHOPAEDIC      right big toe. and left small toes amputated, , seveal other foot surgert       Allergies   Allergen Reactions   ??? Bactrim [Sulfamethoprim Ds] Hives   ??? Keflex [Cephalexin] Rash   ??? Pcn [Penicillins] Hives      Social History     Social History   ??? Marital status: SINGLE     Spouse name: N/A   ??? Number of children: N/A   ??? Years of education: N/A     Occupational History   ??? Not on file.     Social History Main Topics   ??? Smoking status: Never Smoker   ??? Smokeless tobacco: Not on file   ??? Alcohol use No   ??? Drug use: Not on file   ??? Sexual activity: Not on file     Other Topics Concern   ??? Not on file      Social History Narrative   ??? No narrative on file     No family history on file.     Current Facility-Administered Medications   Medication Dose Route Frequency   ??? nystatin (MYCOSTATIN) 100,000 unit/gram powder   Topical BID       Review of Systems    Review of Systems   Constitutional: Positive for diaphoresis.   Respiratory: Positive for shortness of breath.    Cardiovascular: Positive for chest pain.   All other systems reviewed and are negative.         Subjective:     Visit Vitals   ??? BP 144/85   ??? Pulse 87   ??? Temp 97.1 ??F (36.2 ??C)   ??? Resp 18   ??? Ht 6' 3" (1.905 m)   ??? Wt 152.1 kg (335 lb 6.4 oz)   ??? SpO2 95%   ??? BMI 41.92 kg/m2     Physical Exam   Constitutional: He is oriented to person, place,  and time and well-developed, well-nourished, and in no distress.   Eyes: Pupils are equal, round, and reactive to light.   Neck: Normal range of motion. Neck supple.   Cardiovascular: Normal rate and regular rhythm.    Pulmonary/Chest: Effort normal and breath sounds normal.   Abdominal: Soft. Bowel sounds are normal.   Musculoskeletal: Normal range of motion. He exhibits edema.   Trace edema in LEs   Neurological: He is alert and oriented to person, place, and time.   Skin: Skin is warm and dry.   Redness to right groin   Psychiatric: Affect normal.       Cardiographics  Telemetry: normal sinus rhythm  ECG: normal EKG, normal sinus rhythm  Echocardiogram: ordered/pending    Labs:   Recent Results (from the past 24 hour(s))   POC TROPONIN-I    Collection Time: 05/09/15 10:45 PM   Result Value Ref Range    Troponin-I (POC) 0.01 0.0 - 0.08 ng/ml   CBC WITH AUTOMATED DIFF    Collection Time: 05/09/15 10:46 PM   Result Value Ref Range    WBC 7.9 4.3 - 11.1 K/uL    RBC 6.07 (H) 4.23 - 5.67 M/uL    HGB 18.9 (H) 13.6 - 17.2 g/dL    HCT 55.2 (H) 41.1 - 50.3 %    MCV 90.9 79.6 - 97.8 FL    MCH 31.1 26.1 - 32.9 PG    MCHC 34.2 31.4 - 35.0 g/dL    RDW 13.4 11.9 - 14.6 %    PLATELET 187 150 - 450 K/uL     MPV 12.1 10.8 - 14.1 FL    DF AUTOMATED      NEUTROPHILS 51 43 - 78 %    LYMPHOCYTES 35 13 - 44 %    MONOCYTES 12 4.0 - 12.0 %    EOSINOPHILS 1 0.5 - 7.8 %    BASOPHILS 0 0.0 - 2.0 %    IMMATURE GRANULOCYTES 1.0 0.0 - 5.0 %    ABS. NEUTROPHILS 4.0 1.7 - 8.2 K/UL    ABS. LYMPHOCYTES 2.7 0.5 - 4.6 K/UL    ABS. MONOCYTES 0.9 0.1 - 1.3 K/UL    ABS. EOSINOPHILS 0.1 0.0 - 0.8 K/UL    ABS. BASOPHILS 0.0 0.0 - 0.2 K/UL    ABS. IMM. GRANS. 0.1 0.0 - 0.5 K/UL   METABOLIC PANEL, COMPREHENSIVE    Collection Time: 05/09/15 10:46 PM   Result Value Ref Range    Sodium 133 (L) 136 - 145 mmol/L    Potassium 4.0 3.5 - 5.1 mmol/L    Chloride 97 (L) 98 - 107 mmol/L    CO2 24 21 - 32 mmol/L    Anion gap 12 7 - 16 mmol/L    Glucose 239 (H) 65 - 100 mg/dL    BUN 19 6 - 23 MG/DL    Creatinine 1.08 0.8 - 1.5 MG/DL    GFR est AA >60 >60 ml/min/1.51m    GFR est non-AA >60 >60 ml/min/1.781m   Calcium 9.1 8.3 - 10.4 MG/DL    Bilirubin, total 0.8 0.2 - 1.1 MG/DL    ALT (SGPT) 49 12 - 65 U/L    AST (SGOT) 34 15 - 37 U/L    Alk. phosphatase 80 50 - 136 U/L    Protein, total 7.9 6.3 - 8.2 g/dL    Albumin 3.7 3.5 - 5.0 g/dL    Globulin 4.2 (H) 2.3 - 3.5 g/dL    A-G Ratio 0.9 (  L) 1.2 - 3.5     POC TROPONIN-I    Collection Time: 05/10/15  1:40 AM   Result Value Ref Range    Troponin-I (POC) 0.01 0.0 - 0.08 ng/ml       Patient has been seen and examined by Dr. Jolaine Click and he agrees with the following assessment and plan:     Assessment/Plan:        Chest pain -- admit to telemetry for observation. Recheck troponin this AM. Continue ASA, ACEI and statin therapy. Start Lopressor BID. Check lipid panel and echo this AM. NPO now with IV hydration. Plan for LHC with possible PCI in the AM.       Hypertension -- continue home medications. Start BB therapy. Monitor BP closely. Titrate medications as needed.       Dyslipidemia -- continue statin therapy. Check lipid panel this morning.        Type II diabetes mellitus -- continue home dose glipizide. Add SSI ACHS for glucose monitoring and control.       Morbid obesity         Dorthy Cooler, NP  05/10/2015 3:41 AM

## 2015-05-10 NOTE — Progress Notes (Signed)
Patient discharged back to room 330 .  TR band dressing on right radial. No hematoma, no bleeding.  Dressing is clean, dry and intact. Patient reminded not to lift or apply pressure to right arm today,. Verbalizes understanding. Chart with patient. All belongings returned to patient.

## 2015-05-10 NOTE — ED Notes (Signed)
Rates chest pain 3/10 prior to administration of NTG

## 2015-05-10 NOTE — Progress Notes (Signed)
Beverages given to patient

## 2015-05-10 NOTE — ED Notes (Addendum)
Pt remains in NSR.  Occ. unifocal PVC's noted

## 2015-05-10 NOTE — Progress Notes (Addendum)
Physical Exam   Skin:

## 2015-05-10 NOTE — Progress Notes (Signed)
Assumed patient care from Janice, RN

## 2015-05-10 NOTE — Progress Notes (Signed)
[facility]. Procedural findings communicated. Intra procedural  medication administration reviewed. Progression of care discussed.     [facility]    Access site without bleeding or swelling yes    Dressing dry and intact yes    Patient instructed to limit movement to right upper extremity    Routine post procedural vital signs and site assessment initiated yes

## 2015-05-10 NOTE — Other (Signed)
Attempted echo. Patient OTF in Cath Lab. Will do echo at a later time.

## 2015-05-10 NOTE — Other (Signed)
Report called to Tresa EndoKelly, RN [facility].  #20 jelco via the left hand intact without swelling or redness noted

## 2015-05-10 NOTE — Progress Notes (Signed)
Patient returned to room without TR band. Right radial with dressing clean, dry and intact. Denies pain. Strong palpated pulse. Patient instructed to no lifting or deep bending of wrist. Patient voices understanding.

## 2015-05-10 NOTE — Progress Notes (Signed)
Verbal and bedside report given to Roslyn, RN

## 2015-05-10 NOTE — Progress Notes (Signed)
Bedside and Verbal shift change report given to Lattie HawJanice Phillips, RN (oncoming nurse) by self Kandis Ban(offgoing nurse). Report included the following information SBAR, Kardex, MAR and Recent Results. NPO for possible heart cath

## 2015-05-10 NOTE — Progress Notes (Signed)
[facility] PROGRESS NOTE           05/10/2015 7:49 AM    Admit Date: 05/10/2015      Subjective:   Vague cp persists    ROS:  Cardiovascular:  As noted above    Objective:      Vitals:    05/10/15 0310   BP: 144/85   Pulse: 87   Resp: 18   Temp: 97.1 ??F (36.2 ??C)   SpO2: 95%   Weight: 152.1 kg (335 lb 6.4 oz)   Height: 6\' 3"  (1.905 m)       Physical Exam:  General-No Acute Distress  Neck- supple, no JVD  CV- regular rate and rhythm no MRG  Lung- clear bilaterally  Abd- soft, nontender, nondistended  Ext- no edema bilaterally.  Skin- warm and dry    Data Review:   Recent Labs      05/10/15   0415  05/09/15   2246   NA  136  133*   K  3.9  4.0   BUN  20  19   CREA  1.00  1.08   GLU  259*  239*   WBC  5.7  7.9   HGB  17.8*  18.9*   HCT  52.1*  55.2*   PLT  167  187   TROIQ  <0.02*   --    CHOL  176   --    TGL  120   --    LDLC  113*   --    HDL  39*   --        Assessment/Plan:     Principal Problem:    Chest pain (05/10/2015)    Active Problems:    Hypertension (05/10/2015)      Dyslipidemia (05/10/2015)      Type II diabetes mellitus (HCC) (05/10/2015)      Morbid obesity (HCC) (05/10/2015)    ///  Will proceed to a heart cath      Roselind MessierGreg W Gennesis Hogland, MD  05/10/2015 7:49 AM

## 2015-05-10 NOTE — ED Notes (Signed)
Appears to be sleeping at the present time.  No c/o pain.  NTG held per protocol

## 2015-05-10 NOTE — ED Notes (Signed)
Pt rates chest pain at 6/10 prior to NTG administration

## 2015-05-10 NOTE — Progress Notes (Signed)
Report called to Starr, RN in cpru.

## 2015-05-10 NOTE — Progress Notes (Signed)
TRANSFER - IN REPORT:    Verbal report received from Morton AmyElizabeth Lanford, RN (name) on Karolee StampsDuane Raschke  being received from ED (unit) for routine progression of care      Report consisted of patient???s Situation, Background, Assessment and   Recommendations(SBAR).     Information from the following report(s) ED Summary was reviewed with the receiving nurse.    Opportunity for questions and clarification was provided.      Assessment completed upon patient???s arrival to unit and care assumed.

## 2015-05-10 NOTE — Progress Notes (Addendum)
Verbal order from Sol BlazingStalina Jennings, NP;  They do not want this [place] tonight.  Ok for patient to stay the night and discharge in the morning.  Patient is non-monitored and without IV access.

## 2015-05-10 NOTE — Progress Notes (Signed)
TRANSFER - OUT REPORT:    Verbal report given to Liborio NixonJanice, RN(name) on Gap IncDuane Derrig  being transferred to 3rd tele(unit) for routine progression of care       Report consisted of patient???s Situation, Background, Assessment and   Recommendations(SBAR).     Information from the following report(s) SBAR was reviewed with the receiving nurse.    is allergic to bactrim [sulfamethoprim ds]; keflex [cephalexin]; and pcn [penicillins].    Opportunity for questions and clarification was provided.      Procedure Summary:Pt had LHC via R wrist, site sealed with R band using 12 ml at 0930 hrs.    Med Administration    Versed:  4 mg    Visit Vitals   ??? BP 120/72 (BP 1 Location: Left arm, BP Patient Position: Supine)   ??? Pulse 72   ??? Temp 97.9 ??F (36.6 ??C)   ??? Resp 16   ??? Ht 6\' 3"  (1.905 m)   ??? Wt 152.1 kg (335 lb 6.4 oz)   ??? SpO2 95%   ??? BMI 41.92 kg/m2     Past Medical History:   Diagnosis Date   ??? Hypertension            Peripheral IV 05/09/15 Left Hand (Active)   Site Assessment Clean, dry, & intact 05/10/2015  6:13 AM   Phlebitis Assessment 0 05/10/2015  6:13 AM   Infiltration Assessment 0 05/10/2015  6:13 AM   Dressing Status Clean, dry, & intact 05/10/2015  6:13 AM   Dressing Type Transparent 05/10/2015  6:13 AM   Hub Color/Line Status Infusing 05/10/2015  6:13 AM       Peripheral IV 05/10/15 Right;Upper Arm (Active)   Site Assessment Clean, dry, & intact 05/10/2015  6:13 AM   Phlebitis Assessment 0 05/10/2015  6:13 AM   Infiltration Assessment 0 05/10/2015  6:13 AM   Dressing Status Clean, dry, & intact 05/10/2015  6:13 AM   Dressing Type Transparent 05/10/2015  6:13 AM   Hub Color/Line Status Patent 05/10/2015  6:13 AM

## 2015-05-10 NOTE — Procedures (Signed)
Brief Cardiac Procedure Note    Patient: Karolee StampsDuane Peeples MRN: [id number]  SSN: [id number]    Date of Birth: September 06, 1960  Age: 55 y.o.  Sex: male      Date of Procedure: 05/10/2015     Pre-procedure Diagnosis: Chest pain CCS Class IV    Post-procedure Diagnosis: Coronary Artery Disease    Procedure: coronary angio    Brief Description of Procedure: via rra    Performed By: Roselind MessierGreg W Ronen Bromwell, MD     Assistants:     Anesthesia: Moderate Sedation    Estimated Blood Loss: Less than 10 mL      Specimens: None    Implants: None    Findings:   Cors diffuse, no obstruction    Complications: None    Recommendations: Continue medical therapy.    Signed By: Roselind MessierGreg W Lin Glazier, MD     May 10, 2015

## 2015-05-11 ENCOUNTER — Encounter (HOSPITAL_COMMUNITY): Payer: Self-pay | Admitting: Emergency Medicine

## 2015-05-11 ENCOUNTER — Emergency Department (HOSPITAL_COMMUNITY)
Admission: EM | Admit: 2015-05-11 | Discharge: 2015-05-12 | Disposition: A | Discharge status: Home / Self Care | Payer: Self-pay | Attending: Emergency Medicine | Admitting: Emergency Medicine

## 2015-05-11 DIAGNOSIS — E785 Hyperlipidemia, unspecified: Secondary | ICD-10-CM | POA: Insufficient documentation

## 2015-05-11 DIAGNOSIS — T518X1A Toxic effect of other alcohols, accidental (unintentional), initial encounter: Secondary | ICD-10-CM | POA: Insufficient documentation

## 2015-05-11 DIAGNOSIS — Y998 Other external cause status: Secondary | ICD-10-CM | POA: Insufficient documentation

## 2015-05-11 DIAGNOSIS — Y9389 Activity, other specified: Secondary | ICD-10-CM | POA: Insufficient documentation

## 2015-05-11 DIAGNOSIS — T6591XA Toxic effect of unspecified substance, accidental (unintentional), initial encounter: Secondary | ICD-10-CM

## 2015-05-11 DIAGNOSIS — Z7984 Long term (current) use of oral hypoglycemic drugs: Secondary | ICD-10-CM | POA: Insufficient documentation

## 2015-05-11 DIAGNOSIS — E1165 Type 2 diabetes mellitus with hyperglycemia: Secondary | ICD-10-CM | POA: Insufficient documentation

## 2015-05-11 DIAGNOSIS — F1310 Sedative, hypnotic or anxiolytic abuse, uncomplicated: Secondary | ICD-10-CM | POA: Insufficient documentation

## 2015-05-11 DIAGNOSIS — R739 Hyperglycemia, unspecified: Secondary | ICD-10-CM

## 2015-05-11 DIAGNOSIS — Y9289 Other specified places as the place of occurrence of the external cause: Secondary | ICD-10-CM | POA: Insufficient documentation

## 2015-05-11 DIAGNOSIS — Z880 Allergy status to penicillin: Secondary | ICD-10-CM | POA: Insufficient documentation

## 2015-05-11 DIAGNOSIS — I10 Essential (primary) hypertension: Secondary | ICD-10-CM | POA: Insufficient documentation

## 2015-05-11 DIAGNOSIS — Z79899 Other long term (current) drug therapy: Secondary | ICD-10-CM | POA: Insufficient documentation

## 2015-05-11 LAB — GLUCOSE, POC
Glucose (POC): 311 mg/dL — ABNORMAL HIGH (ref 65–100)
Glucose (POC): 249 mg/dL — ABNORMAL HIGH (ref 65–100)

## 2015-05-11 LAB — RAPID URINE DRUG SCREEN, HOSP PERFORMED
Amphetamines: NOT DETECTED
Barbiturates: NOT DETECTED
Benzodiazepines: POSITIVE — AB
COCAINE: NOT DETECTED
OPIATES: NOT DETECTED
TETRAHYDROCANNABINOL: NOT DETECTED

## 2015-05-11 LAB — COMPREHENSIVE METABOLIC PANEL
ALBUMIN: 3.9 g/dL (ref 3.5–5.0)
ALK PHOS: 85 U/L (ref 38–126)
ALT: 42 U/L (ref 17–63)
ANION GAP: 12 (ref 5–15)
AST: 37 U/L (ref 15–41)
BILIRUBIN TOTAL: 0.6 mg/dL (ref 0.3–1.2)
BUN: 28 mg/dL — ABNORMAL HIGH (ref 6–20)
CO2: 22 mmol/L (ref 22–32)
Calcium: 9.1 mg/dL (ref 8.9–10.3)
Chloride: 100 mmol/L — ABNORMAL LOW (ref 101–111)
Creatinine, Ser: 1.22 mg/dL (ref 0.61–1.24)
GFR calc Af Amer: >60 mL/min (ref >60)
GFR calc non Af Amer: >60 mL/min (ref >60)
GLUCOSE: 457 mg/dL — AB (ref 65–99)
POTASSIUM: 5 mmol/L (ref 3.5–5.1)
Sodium: 134 mmol/L — ABNORMAL LOW (ref 135–145)
Total Protein: 7 g/dL (ref 6.5–8.1)

## 2015-05-11 LAB — CBC
HEMATOCRIT: 52.7 % — AB (ref 39.0–52.0)
HEMOGLOBIN: 17.6 g/dL — AB (ref 13.0–17.0)
MCH: 31.2 pg (ref 26.0–34.0)
MCHC: 33.4 g/dL (ref 30.0–36.0)
MCV: 93.3 fL (ref 78.0–100.0)
Platelets: 177 10*3/uL (ref 150–400)
RBC: 5.65 MIL/uL (ref 4.22–5.81)
RDW: 13.4 % (ref 11.5–15.5)
WBC: 7.2 10*3/uL (ref 4.0–10.5)

## 2015-05-11 LAB — CBG MONITORING, ED: Glucose-Capillary: 408 mg/dL — ABNORMAL HIGH (ref 65–99)

## 2015-05-11 MED FILL — METOPROLOL TARTRATE 25 MG TAB: 25 mg | ORAL | Qty: 1

## 2015-05-11 MED FILL — SIMVASTATIN 20 MG TAB: 20 mg | ORAL | Qty: 1

## 2015-05-11 MED FILL — LISINOPRIL-HYDROCHLOROTHIAZIDE 20 MG-25 MG TAB: 20-25 mg | ORAL | Qty: 1

## 2015-05-11 MED FILL — ASPIRIN 81 MG CHEWABLE TAB: 81 mg | ORAL | Qty: 1

## 2015-05-11 MED FILL — GLIPIZIDE 5 MG TAB: 5 mg | ORAL | Qty: 1

## 2015-05-11 NOTE — Procedures (Signed)
[facility]   CARDIAC CATHETERIZATION       Name:  Steven Farmer, Steven Farmer   MR#:  [id number]   DOB:  07/08/1960   Account #:  [id number]   Date of Adm:  05/10/2015       DATE OF PROCEDURE: 05/10/2015      PROCEDURES PERFORMED: Cardiac catheterization.    HISTORY: This is a 55-year-old gentleman admitted with prolonged   chest pain. Multiple risk factors for coronary disease are   present. Based on the nature of his complaint and his risk   factors, it was felt that proceeding directly to cardiac   catheterization was warranted.    PROCEDURE: Coronary angiography is performed via the right   radial artery by modified Seldinger technique. Left is injected   with a 5-French 3.5 left Judkins. The right is injected with a   5-French multipurpose. He tolerated it well.    FINDINGS: Left main coronary is normal. It divides into an LAD   and left circumflex.      The LAD has mild diffuse atherosclerotic changes along the   course of the vessel with no significant stenosis.      The left circumflex has mild diffuse disease with no significant   stenosis.      The right coronary artery is a large vessel. There is moderate   mid portion disease with no significant stenosis. There is   calcification in this region. The distal vessel has minor   disease.    IMPRESSION: Nonobstructive coronary artery disease.    RECOMMENDATIONS: Medical therapy.        Cassadee Vanzandt W. Geraldean Walen, MD      GWS / KMS   D:  05/13/2015   14:54   T:  05/13/2015   15:14   Job #:  [id number]

## 2015-05-11 NOTE — Progress Notes (Signed)
Patient's discharge instructions reviewed.  Peripheral iv's removed, cardiac monitor returned to monitor room.

## 2015-05-11 NOTE — Progress Notes (Signed)
Taxi transport arranged via [employer] to take pt to the [facility] parking lot.

## 2015-05-11 NOTE — Progress Notes (Signed)
Beside shift report to Ainsley SpinnerBryan Ruehmer, RN.

## 2015-05-11 NOTE — ED Provider Notes (Signed)
CSN: [id number]     Arrival date & time 05/11/15  2006 History   First MD Initiated Contact with Patient 05/11/15 2056     Chief Complaint  Patient presents with  . Ingestion     (Consider location/radiation/quality/duration/timing/severity/associated sxs/prior Treatment) HPI  Derrick Austin is a 55 y.o. malewho  Is here for evaluation of accidental ingestion of antifreeze. He states that he had some leftover antifreeze in a cup,and  accidentally drank "a gulp", while he was sitting in his car.He denies intentional self-harm.He states that he gagged, and choked a little bit, when he drank it, but he did swallow some of the material. He was short of breath for little bit, but  Is better now. He did not vomit. He denies shortness of breath, paresthesias, headache, chest pain, weakness or dizziness. No prior similar problem. There are no other known modifying factors.   Past Medical History  Diagnosis Date  . Diabetes mellitus without complication (HCC)   . Hypertension   . Hyperlipidemia    Past Surgical History  Procedure Laterality Date  . Appendectomy    . Eye surgery    . Ankle surgery    . Toe amputation    . Knee surgery    . Hammer toe surgery      both great toes   History reviewed. No pertinent family history. Social History  Substance Use Topics  . Smoking status: Never Smoker   . Smokeless tobacco: Never Used  . Alcohol Use: No    Review of Systems  All other systems reviewed and are negative.     Allergies  Bactrim; Penicillins; and Keflex  Home Medications   Prior to Admission medications   Medication Sig Start Date End Date Taking? Authorizing Provider  glimepiride (AMARYL) 2 MG tablet Take 2 mg by mouth daily. 12/25/13  Yes Historical Provider, MD  lisinopril-hydrochlorothiazide (PRINZIDE,ZESTORETIC) 20-25 MG tablet Take 1 tablet by mouth daily. 08/26/13  Yes Historical Provider, MD  simvastatin (ZOCOR) 20 MG tablet Take 20 mg by mouth daily. 08/31/14  Yes  Historical Provider, MD   BP 129/79 mmHg  Pulse 94  Temp(Src) 97.8 F (36.6 C) (Oral)  Resp 19  SpO2 94% Physical Exam  Constitutional: He is oriented to person, place, and time. He appears well-developed and well-nourished. No distress.  HENT:  Head: Normocephalic and atraumatic.  Right Ear: External ear normal.  Left Ear: External ear normal.  Eyes: Conjunctivae and EOM are normal. Pupils are equal, round, and reactive to light.  Neck: Normal range of motion and phonation normal. Neck supple.  Cardiovascular: Normal rate, regular rhythm and normal heart sounds.   Pulmonary/Chest: Effort normal and breath sounds normal. No respiratory distress. He has no wheezes. He has no rales. He exhibits no bony tenderness.  Abdominal: Soft. There is no tenderness.  Musculoskeletal: Normal range of motion.  Neurological: He is alert and oriented to person, place, and time. No cranial nerve deficit or sensory deficit. He exhibits normal muscle tone. Coordination normal.  Skin: Skin is warm, dry and intact.  Psychiatric: He has a normal mood and affect. His behavior is normal. Judgment and thought content normal.  Nursing note and vitals reviewed.   ED Course  Procedures (including critical care time)  Medications  sodium chloride 0.9 % bolus 1,000 mL (not administered)    Patient Vitals for the past 24 hrs:  BP Temp Temp src Pulse Resp SpO2  05/11/15 2300 129/79 mmHg - - 94 19 94 %  05/11/15 2230 123/75 mmHg - - 96 18 93 %  05/11/15 2200 116/74 mmHg - - 100 21 94 %  05/11/15 2146 120/82 mmHg - - 102 19 94 %  05/11/15 2023 131/92 mmHg 97.8 F (36.6 C) Oral (!) 124 18 91 %    12:45 AM Reevaluation with update and discussion. After initial assessment and treatment, an updated evaluation reveals Patient remains comfortable.No respiratory distress or difficulty swallowing secretions. He has been able to tolerate oral liquids.He denies dizziness. There is no nystagmus. Vital signs are normal.  Repeat blood testing ordered as directed by  Poison control. Vencent Hauschild L     Labs Review Labs Reviewed  URINE RAPID DRUG SCREEN, HOSP PERFORMED - Abnormal; Notable for the following:    Benzodiazepines POSITIVE (*)    All other components within normal limits  CBC - Abnormal; Notable for the following:    Hemoglobin 17.6 (*)    HCT 52.7 (*)    All other components within normal limits  COMPREHENSIVE METABOLIC PANEL - Abnormal; Notable for the following:    Sodium 134 (*)    Chloride 100 (*)    Glucose, Bld 457 (*)    BUN 28 (*)    All other components within normal limits  CBG MONITORING, ED - Abnormal; Notable for the following:    Glucose-Capillary 408 (*)    All other components within normal limits  CBG MONITORING, ED  I-STAT CHEM 8, ED    Imaging Review No results found. I have personally reviewed and evaluated these images and lab results as part of my medical decision-making.   EKG Interpretation   Date/Time:  Wednesday May 11 2015 20:35:49 EST Ventricular Rate:  116 PR Interval:  65 QRS Duration: 90 QT Interval:  375 QTC Calculation: 521 R Axis:   16 Text Interpretation:  Sinus or ectopic atrial tachycardia Low voltage,  precordial leads Prolonged QT interval Baseline wander in lead(s) II Since  last tracing QT has lengthened Confirmed by Effie Shy  MD, Ora Mcnatt ([id number]) on  05/11/2015 9:33:41 PM      MDM   Final diagnoses:  Ingestion of nontoxic substance, initial encounter  Hyperglycemia    Accidental ingestion of antifreeze.No clinical evidence for significant injury.Incidental hyperglycemia noted on blood testing.  Nursing Notes Reviewed/ Care Coordinated, and agree without changes. Applicable Imaging Reviewed.  Interpretation of Laboratory Data incorporated into ED treatment   Plan- Anticipate discharge, after return of repeat metabolic panel. Incidental hyperglycemia will require increase oral fluids, and close follow-up with PCP,  For possible  medication adjustment, versus dietary screening and intervention.    Mancel Bale, MD 05/14/15 2133

## 2015-05-11 NOTE — ED Notes (Addendum)
[facility]ise [facility]: -due to tachycardia and initial low pulse ox-observe for 4 hours -monitor -observe for any dizziness -nausea/vomiting -Stomach pain -obtain BMP in 4 hours

## 2015-05-11 NOTE — ED Notes (Addendum)
Patient reports that he accidentally swallowed antifreeze. States that a couple of weeks ago he poured antifreeze in a cup and left it in the car. States that he was extremely tired and accidentally grab the antifreeze cup instead of the coffee cup. Denies SI/HI. Denies nausea/vomiting. States that he feels like his throat is closing up.

## 2015-05-11 NOTE — ED Notes (Signed)
Pt is a diabetic and states that he has already taken his normal daily meds today.

## 2015-05-12 LAB — I-STAT CHEM 8, ED
BUN: 23 mg/dL — ABNORMAL HIGH (ref 6–20)
CALCIUM ION: 1.18 mmol/L (ref 1.12–1.23)
Chloride: 101 mmol/L (ref 101–111)
Creatinine, Ser: 0.9 mg/dL (ref 0.61–1.24)
Glucose, Bld: 349 mg/dL — ABNORMAL HIGH (ref 65–99)
HEMATOCRIT: 56 % — AB (ref 39.0–52.0)
HEMOGLOBIN: 19 g/dL — AB (ref 13.0–17.0)
Potassium: 4.4 mmol/L (ref 3.5–5.1)
SODIUM: 138 mmol/L (ref 135–145)
TCO2: 21 mmol/L (ref 0–100)

## 2015-05-12 MED ORDER — SODIUM CHLORIDE 0.9 % IV BOLUS (SEPSIS)
1000.0000 mL | Freq: Once | INTRAVENOUS | Status: AC
  Administered 2015-05-12: 1000 mL via INTRAVENOUS

## 2015-05-12 NOTE — Discharge Instructions (Signed)
Try to drink an extra 2 L of water each day to help bring her glucose down. Stay on your low carbohydrate diet.   Hyperglycemia Hyperglycemia occurs when the glucose (sugar) in your blood is too high. Hyperglycemia can happen for many reasons, but it most often happens to people who do not know they have diabetes or are not managing their diabetes properly.  CAUSES  Whether you have diabetes or not, there are other causes of hyperglycemia. Hyperglycemia can occur when you have diabetes, but it can also occur in other situations that you might not be as aware of, such as: Diabetes  If you have diabetes and are having problems controlling your blood glucose, hyperglycemia could occur because of some of the following reasons:  Not following your meal plan.  Not taking your diabetes medications or not taking it properly.  Exercising less or doing less activity than you normally do.  Being sick. Pre-diabetes  This cannot be ignored. Before people develop Type 2 diabetes, they almost always have "pre-diabetes." This is when your blood glucose levels are higher than normal, but not yet high enough to be diagnosed as diabetes. Research has shown that some long-term damage to the body, especially the heart and circulatory system, may already be occurring during pre-diabetes. If you take action to manage your blood glucose when you have pre-diabetes, you may delay or prevent Type 2 diabetes from developing. Stress  If you have diabetes, you may be "diet" controlled or on oral medications or insulin to control your diabetes. However, you may find that your blood glucose is higher than usual in the hospital whether you have diabetes or not. This is often referred to as "stress hyperglycemia." Stress can elevate your blood glucose. This happens because of hormones put out by the body during times of stress. If stress has been the cause of your high blood glucose, it can be followed regularly by your  caregiver. That way he/she can make sure your hyperglycemia does not continue to get worse or progress to diabetes. Steroids  Steroids are medications that act on the infection fighting system (immune system) to block inflammation or infection. One side effect can be a rise in blood glucose. Most people can produce enough extra insulin to allow for this rise, but for those who cannot, steroids make blood glucose levels go even higher. It is not unusual for steroid treatments to "uncover" diabetes that is developing. It is not always possible to determine if the hyperglycemia will go away after the steroids are stopped. A special blood test called an A1c is sometimes done to determine if your blood glucose was elevated before the steroids were started. SYMPTOMS  Thirsty.  Frequent urination.  Dry mouth.  Blurred vision.  Tired or fatigue.  Weakness.  Sleepy.  Tingling in feet or leg. DIAGNOSIS  Diagnosis is made by monitoring blood glucose in one or all of the following ways:  A1c test. This is a chemical found in your blood.  Fingerstick blood glucose monitoring.  Laboratory results. TREATMENT  First, knowing the cause of the hyperglycemia is important before the hyperglycemia can be treated. Treatment may include, but is not be limited to:  Education.  Change or adjustment in medications.  Change or adjustment in meal plan.  Treatment for an illness, infection, etc.  More frequent blood glucose monitoring.  Change in exercise plan.  Decreasing or stopping steroids.  Lifestyle changes. HOME CARE INSTRUCTIONS   Test your blood glucose as directed.  Exercise regularly. Your caregiver will give you instructions about exercise. Pre-diabetes or diabetes which comes on with stress is helped by exercising.  Eat wholesome, balanced meals. Eat often and at regular, fixed times. Your caregiver or nutritionist will give you a meal plan to guide your sugar intake.  Being at  an ideal weight is important. If needed, losing as little as 10 to 15 pounds may help improve blood glucose levels. SEEK MEDICAL CARE IF:   You have questions about medicine, activity, or diet.  You continue to have symptoms (problems such as increased thirst, urination, or weight gain). SEEK IMMEDIATE MEDICAL CARE IF:   You are vomiting or have diarrhea.  Your breath smells fruity.  You are breathing faster or slower.  You are very sleepy or incoherent.  You have numbness, tingling, or pain in your feet or hands.  You have chest pain.  Your symptoms get worse even though you have been following your caregiver's orders.  If you have any other questions or concerns.   This information is not intended to replace advice given to you by your health care provider. Make sure you discuss any questions you have with your health care provider.   Document Released: 08/15/2000 Document Revised: 05/14/2011 Document Reviewed: 10/26/2014 Elsevier Interactive Patient Education [employer]

## 2015-05-12 NOTE — ED Notes (Signed)
[facility] for follow up. Was given report.

## 2015-05-13 NOTE — Procedures (Signed)
[facility]   C[facility]IAC [facility]       Name:  Steven StampsCOE, Can   MR#:  [id number]   DOB:  1960/12/05   Account #:  [id number]   Date of Adm:  05/10/2015       DATE OF PROCEDURE: 05/10/2015      PROCEDURES PERFORMED: Cardiac catheterization.    HISTORY: This is a 55 year old gentleman admitted with prolonged   chest pain. Multiple risk factors for coronary disease are   present. Based on the nature of his complaint and his risk   factors, it was felt that proceeding directly to cardiac   catheterization was warranted.    PROCEDURE: Coronary angiography is performed via the right   radial artery by modified Seldinger technique. Left is injected   with a 5-French 3.5 left Judkins. The right is injected with a   5-French multipurpose. He tolerated it well.    FINDINGS: Left main coronary is normal. It divides into an LAD   and left circumflex.      The LAD has mild diffuse atherosclerotic changes along the   course of the vessel with no significant stenosis.      The left circumflex has mild diffuse disease with no significant   stenosis.      The right coronary artery is a large vessel. There is moderate   mid portion disease with no significant stenosis. There is   calcification in this region. The distal vessel has minor   disease.    IMPRESSION: Nonobstructive coronary artery disease.    RECOMMENDATIONS: Medical therapy.        Roselind MessierGREG W. Tanyia Grabbe, MD      GWS / KMS   D:  05/13/2015   14:54   T:  05/13/2015   15:14   Job #:  [id number]

## 2015-05-30 ENCOUNTER — Emergency Department (HOSPITAL_COMMUNITY)
Admission: EM | Admit: 2015-05-30 | Discharge: 2015-05-31 | Disposition: A | Discharge status: Home / Self Care | Payer: Self-pay | Attending: Emergency Medicine | Admitting: Emergency Medicine

## 2015-05-30 ENCOUNTER — Emergency Department (HOSPITAL_COMMUNITY): Payer: Self-pay

## 2015-05-30 ENCOUNTER — Encounter (HOSPITAL_COMMUNITY): Payer: Self-pay | Admitting: Emergency Medicine

## 2015-05-30 DIAGNOSIS — R079 Chest pain, unspecified: Secondary | ICD-10-CM | POA: Insufficient documentation

## 2015-05-30 DIAGNOSIS — R0602 Shortness of breath: Secondary | ICD-10-CM | POA: Insufficient documentation

## 2015-05-30 DIAGNOSIS — E119 Type 2 diabetes mellitus without complications: Secondary | ICD-10-CM | POA: Insufficient documentation

## 2015-05-30 DIAGNOSIS — M79604 Pain in right leg: Secondary | ICD-10-CM | POA: Insufficient documentation

## 2015-05-30 DIAGNOSIS — I10 Essential (primary) hypertension: Secondary | ICD-10-CM | POA: Insufficient documentation

## 2015-05-30 DIAGNOSIS — M79605 Pain in left leg: Secondary | ICD-10-CM | POA: Insufficient documentation

## 2015-05-30 LAB — CBC
HCT: 54.4 % — ABNORMAL HIGH (ref 39.0–52.0)
Hemoglobin: 18.8 g/dL — ABNORMAL HIGH (ref 13.0–17.0)
MCH: 31.2 pg (ref 26.0–34.0)
MCHC: 34.6 g/dL (ref 30.0–36.0)
MCV: 90.4 fL (ref 78.0–100.0)
PLATELETS: 197 10*3/uL (ref 150–400)
RBC: 6.02 MIL/uL — ABNORMAL HIGH (ref 4.22–5.81)
RDW: 13 % (ref 11.5–15.5)
WBC: 8.2 10*3/uL (ref 4.0–10.5)

## 2015-05-30 LAB — BASIC METABOLIC PANEL
Anion gap: 14 (ref 5–15)
BUN: 15 mg/dL (ref 6–20)
CALCIUM: 9.3 mg/dL (ref 8.9–10.3)
CHLORIDE: 102 mmol/L (ref 101–111)
CO2: 19 mmol/L — AB (ref 22–32)
CREATININE: 1.09 mg/dL (ref 0.61–1.24)
GFR calc Af Amer: >60 mL/min (ref >60)
GFR calc non Af Amer: >60 mL/min (ref >60)
Glucose, Bld: 263 mg/dL — ABNORMAL HIGH (ref 65–99)
Potassium: 4.8 mmol/L (ref 3.5–5.1)
Sodium: 135 mmol/L (ref 135–145)

## 2015-05-30 LAB — I-STAT TROPONIN, ED: Troponin i, poc: 0 ng/mL (ref 0.00–0.08)

## 2015-05-30 NOTE — ED Notes (Signed)
Called patient to take to room Unable to locate patient.

## 2015-05-30 NOTE — ED Notes (Signed)
Pt sts mid sternal to left sided CP and SOB x 1 hour; pt sts bilateral lower leg pain

## 2015-07-02 ENCOUNTER — Encounter (HOSPITAL_COMMUNITY): Payer: Self-pay | Admitting: Emergency Medicine

## 2015-07-02 DIAGNOSIS — T518X2A Toxic effect of other alcohols, intentional self-harm, initial encounter: Principal | ICD-10-CM | POA: Diagnosis present

## 2015-07-02 DIAGNOSIS — Z6841 Body Mass Index (BMI) 40.0 and over, adult: Secondary | ICD-10-CM

## 2015-07-02 DIAGNOSIS — F329 Major depressive disorder, single episode, unspecified: Secondary | ICD-10-CM | POA: Diagnosis present

## 2015-07-02 DIAGNOSIS — K4020 Bilateral inguinal hernia, without obstruction or gangrene, not specified as recurrent: Secondary | ICD-10-CM | POA: Diagnosis present

## 2015-07-02 DIAGNOSIS — E860 Dehydration: Secondary | ICD-10-CM | POA: Diagnosis present

## 2015-07-02 DIAGNOSIS — R1031 Right lower quadrant pain: Secondary | ICD-10-CM | POA: Diagnosis not present

## 2015-07-02 DIAGNOSIS — Z89412 Acquired absence of left great toe: Secondary | ICD-10-CM

## 2015-07-02 DIAGNOSIS — Z881 Allergy status to other antibiotic agents status: Secondary | ICD-10-CM

## 2015-07-02 DIAGNOSIS — Z79899 Other long term (current) drug therapy: Secondary | ICD-10-CM

## 2015-07-02 DIAGNOSIS — Z66 Do not resuscitate: Secondary | ICD-10-CM | POA: Diagnosis present

## 2015-07-02 DIAGNOSIS — E1165 Type 2 diabetes mellitus with hyperglycemia: Secondary | ICD-10-CM | POA: Diagnosis present

## 2015-07-02 DIAGNOSIS — Z7984 Long term (current) use of oral hypoglycemic drugs: Secondary | ICD-10-CM

## 2015-07-02 DIAGNOSIS — I10 Essential (primary) hypertension: Secondary | ICD-10-CM | POA: Diagnosis present

## 2015-07-02 DIAGNOSIS — K5900 Constipation, unspecified: Secondary | ICD-10-CM | POA: Diagnosis present

## 2015-07-02 DIAGNOSIS — E785 Hyperlipidemia, unspecified: Secondary | ICD-10-CM | POA: Diagnosis present

## 2015-07-02 DIAGNOSIS — E874 Mixed disorder of acid-base balance: Secondary | ICD-10-CM | POA: Diagnosis present

## 2015-07-02 DIAGNOSIS — R109 Unspecified abdominal pain: Secondary | ICD-10-CM | POA: Diagnosis present

## 2015-07-02 DIAGNOSIS — Z888 Allergy status to other drugs, medicaments and biological substances status: Secondary | ICD-10-CM

## 2015-07-02 DIAGNOSIS — Z880 Allergy status to penicillin: Secondary | ICD-10-CM

## 2015-07-02 LAB — CBC
HCT: 52.3 % — ABNORMAL HIGH (ref 39.0–52.0)
HEMOGLOBIN: 17.6 g/dL — AB (ref 13.0–17.0)
MCH: 31.3 pg (ref 26.0–34.0)
MCHC: 33.7 g/dL (ref 30.0–36.0)
MCV: 93.1 fL (ref 78.0–100.0)
Platelets: 247 10*3/uL (ref 150–400)
RBC: 5.62 MIL/uL (ref 4.22–5.81)
RDW: 13.5 % (ref 11.5–15.5)
WBC: 8.7 10*3/uL (ref 4.0–10.5)

## 2015-07-02 LAB — URINALYSIS, ROUTINE W REFLEX MICROSCOPIC
BILIRUBIN URINE: NEGATIVE
Glucose, UA: NEGATIVE mg/dL
Hgb urine dipstick: NEGATIVE
Ketones, ur: 15 mg/dL — AB
Leukocytes, UA: NEGATIVE
NITRITE: NEGATIVE
PROTEIN: 30 mg/dL — AB
SPECIFIC GRAVITY, URINE: 1.019 (ref 1.005–1.030)
pH: 5.5 (ref 5.0–8.0)

## 2015-07-02 LAB — URINE MICROSCOPIC-ADD ON
RBC / HPF: NONE SEEN RBC/hpf (ref 0–5)
WBC UA: NONE SEEN WBC/hpf (ref 0–5)

## 2015-07-02 LAB — COMPREHENSIVE METABOLIC PANEL
ALK PHOS: 72 U/L (ref 38–126)
ALT: 42 U/L (ref 17–63)
ANION GAP: 18 — AB (ref 5–15)
AST: 35 U/L (ref 15–41)
Albumin: 3.9 g/dL (ref 3.5–5.0)
BILIRUBIN TOTAL: 0.8 mg/dL (ref 0.3–1.2)
BUN: 18 mg/dL (ref 6–20)
CALCIUM: 9.6 mg/dL (ref 8.9–10.3)
CO2: 14 mmol/L — ABNORMAL LOW (ref 22–32)
Chloride: 105 mmol/L (ref 101–111)
Creatinine, Ser: 1.21 mg/dL (ref 0.61–1.24)
GFR calc non Af Amer: >60 mL/min (ref >60)
GLUCOSE: 245 mg/dL — AB (ref 65–99)
Potassium: 4.4 mmol/L (ref 3.5–5.1)
Sodium: 137 mmol/L (ref 135–145)
TOTAL PROTEIN: 7.1 g/dL (ref 6.5–8.1)

## 2015-07-02 LAB — LIPASE, BLOOD: Lipase: 43 U/L (ref 11–51)

## 2015-07-02 MED ORDER — ONDANSETRON 4 MG PO TBDP
ORAL_TABLET | ORAL | Status: AC
  Filled 2015-07-02: qty 1

## 2015-07-02 MED ORDER — ONDANSETRON 4 MG PO TBDP
4.0000 mg | ORAL_TABLET | Freq: Once | ORAL | Status: AC | PRN
  Administered 2015-07-02: 4 mg via ORAL

## 2015-07-02 NOTE — ED Notes (Signed)
Patient arrives with complaint of sudden onset of right lateral abdominal pain. Onset around 1700. Endorses nausea, but denies emesis. Denies history of similar pain. Hx of appendectomy 45 years ago.

## 2015-07-03 ENCOUNTER — Inpatient Hospital Stay (HOSPITAL_COMMUNITY)
Admission: EM | Admit: 2015-07-03 | Discharge: 2015-07-06 | DRG: 918 | Disposition: A | Discharge status: Home / Self Care | Payer: Managed Care, Other (non HMO) | Attending: Internal Medicine | Admitting: Internal Medicine

## 2015-07-03 ENCOUNTER — Inpatient Hospital Stay (HOSPITAL_COMMUNITY): Payer: Managed Care, Other (non HMO)

## 2015-07-03 ENCOUNTER — Encounter (HOSPITAL_COMMUNITY): Payer: Self-pay | Admitting: *Deleted

## 2015-07-03 ENCOUNTER — Emergency Department (HOSPITAL_COMMUNITY): Payer: Managed Care, Other (non HMO)

## 2015-07-03 DIAGNOSIS — Z7984 Long term (current) use of oral hypoglycemic drugs: Secondary | ICD-10-CM | POA: Diagnosis not present

## 2015-07-03 DIAGNOSIS — I10 Essential (primary) hypertension: Secondary | ICD-10-CM | POA: Diagnosis present

## 2015-07-03 DIAGNOSIS — F329 Major depressive disorder, single episode, unspecified: Secondary | ICD-10-CM | POA: Diagnosis present

## 2015-07-03 DIAGNOSIS — T528X4A Toxic effect of other organic solvents, undetermined, initial encounter: Secondary | ICD-10-CM

## 2015-07-03 DIAGNOSIS — K4020 Bilateral inguinal hernia, without obstruction or gangrene, not specified as recurrent: Secondary | ICD-10-CM | POA: Diagnosis present

## 2015-07-03 DIAGNOSIS — E785 Hyperlipidemia, unspecified: Secondary | ICD-10-CM | POA: Diagnosis present

## 2015-07-03 DIAGNOSIS — R109 Unspecified abdominal pain: Secondary | ICD-10-CM

## 2015-07-03 DIAGNOSIS — R1031 Right lower quadrant pain: Secondary | ICD-10-CM | POA: Diagnosis present

## 2015-07-03 DIAGNOSIS — K5900 Constipation, unspecified: Secondary | ICD-10-CM | POA: Diagnosis present

## 2015-07-03 DIAGNOSIS — E860 Dehydration: Secondary | ICD-10-CM | POA: Diagnosis present

## 2015-07-03 DIAGNOSIS — Z888 Allergy status to other drugs, medicaments and biological substances status: Secondary | ICD-10-CM | POA: Diagnosis not present

## 2015-07-03 DIAGNOSIS — E872 Acidosis, unspecified: Secondary | ICD-10-CM | POA: Insufficient documentation

## 2015-07-03 DIAGNOSIS — T518X2A Toxic effect of other alcohols, intentional self-harm, initial encounter: Secondary | ICD-10-CM | POA: Diagnosis present

## 2015-07-03 DIAGNOSIS — E1165 Type 2 diabetes mellitus with hyperglycemia: Secondary | ICD-10-CM | POA: Diagnosis present

## 2015-07-03 DIAGNOSIS — Z452 Encounter for adjustment and management of vascular access device: Secondary | ICD-10-CM | POA: Diagnosis not present

## 2015-07-03 DIAGNOSIS — Z881 Allergy status to other antibiotic agents status: Secondary | ICD-10-CM | POA: Diagnosis not present

## 2015-07-03 DIAGNOSIS — Z89412 Acquired absence of left great toe: Secondary | ICD-10-CM | POA: Diagnosis not present

## 2015-07-03 DIAGNOSIS — Z79899 Other long term (current) drug therapy: Secondary | ICD-10-CM | POA: Diagnosis not present

## 2015-07-03 DIAGNOSIS — IMO0002 Reserved for concepts with insufficient information to code with codable children: Secondary | ICD-10-CM | POA: Diagnosis present

## 2015-07-03 DIAGNOSIS — Z66 Do not resuscitate: Secondary | ICD-10-CM | POA: Diagnosis present

## 2015-07-03 DIAGNOSIS — E8729 Other acidosis: Secondary | ICD-10-CM | POA: Diagnosis present

## 2015-07-03 DIAGNOSIS — T528X1A Toxic effect of other organic solvents, accidental (unintentional), initial encounter: Secondary | ICD-10-CM | POA: Insufficient documentation

## 2015-07-03 DIAGNOSIS — F331 Major depressive disorder, recurrent, moderate: Secondary | ICD-10-CM | POA: Diagnosis not present

## 2015-07-03 DIAGNOSIS — F32A Depression, unspecified: Secondary | ICD-10-CM | POA: Diagnosis present

## 2015-07-03 DIAGNOSIS — E874 Mixed disorder of acid-base balance: Secondary | ICD-10-CM | POA: Diagnosis present

## 2015-07-03 DIAGNOSIS — Z6841 Body Mass Index (BMI) 40.0 and over, adult: Secondary | ICD-10-CM | POA: Diagnosis not present

## 2015-07-03 DIAGNOSIS — Z880 Allergy status to penicillin: Secondary | ICD-10-CM | POA: Diagnosis not present

## 2015-07-03 HISTORY — DX: Other specified personal risk factors, not elsewhere classified: Z91.89

## 2015-07-03 HISTORY — DX: Major depressive disorder, single episode, in partial remission: F32.4

## 2015-07-03 HISTORY — DX: Personal history of self-harm: Z91.5

## 2015-07-03 HISTORY — DX: Personal history of suicidal behavior: Z91.51

## 2015-07-03 LAB — HEPATIC FUNCTION PANEL
ALBUMIN: 3.5 g/dL (ref 3.5–5.0)
ALT: 34 U/L (ref 17–63)
AST: 26 U/L (ref 15–41)
Alkaline Phosphatase: 55 U/L (ref 38–126)
BILIRUBIN INDIRECT: 0.6 mg/dL (ref 0.3–0.9)
Bilirubin, Direct: 0.1 mg/dL (ref 0.1–0.5)
TOTAL PROTEIN: 6.7 g/dL (ref 6.5–8.1)
Total Bilirubin: 0.7 mg/dL (ref 0.3–1.2)

## 2015-07-03 LAB — CBC
HEMATOCRIT: 48.7 % (ref 39.0–52.0)
HEMOGLOBIN: 15.8 g/dL (ref 13.0–17.0)
MCH: 30.4 pg (ref 26.0–34.0)
MCHC: 32.4 g/dL (ref 30.0–36.0)
MCV: 93.8 fL (ref 78.0–100.0)
Platelets: 192 10*3/uL (ref 150–400)
RBC: 5.19 MIL/uL (ref 4.22–5.81)
RDW: 13.7 % (ref 11.5–15.5)
WBC: 8.1 10*3/uL (ref 4.0–10.5)

## 2015-07-03 LAB — BASIC METABOLIC PANEL
ANION GAP: 19 — AB (ref 5–15)
Anion gap: 19 — ABNORMAL HIGH (ref 5–15)
BUN: 18 mg/dL (ref 6–20)
BUN: 17 mg/dL (ref 6–20)
CALCIUM: 9.4 mg/dL (ref 8.9–10.3)
CALCIUM: 9.1 mg/dL (ref 8.9–10.3)
CHLORIDE: 106 mmol/L (ref 101–111)
CO2: 11 mmol/L — ABNORMAL LOW (ref 22–32)
CO2: 9 mmol/L — AB (ref 22–32)
CREATININE: 1.08 mg/dL (ref 0.61–1.24)
Chloride: 109 mmol/L (ref 101–111)
Creatinine, Ser: 1.12 mg/dL (ref 0.61–1.24)
GFR calc non Af Amer: >60 mL/min (ref >60)
Glucose, Bld: 149 mg/dL — ABNORMAL HIGH (ref 65–99)
Glucose, Bld: 128 mg/dL — ABNORMAL HIGH (ref 65–99)
Potassium: 4.8 mmol/L (ref 3.5–5.1)
Potassium: 4.6 mmol/L (ref 3.5–5.1)
SODIUM: 136 mmol/L (ref 135–145)
Sodium: 137 mmol/L (ref 135–145)

## 2015-07-03 LAB — RENAL FUNCTION PANEL
ALBUMIN: 3.5 g/dL (ref 3.5–5.0)
ANION GAP: 14 (ref 5–15)
BUN: 15 mg/dL (ref 6–20)
CHLORIDE: 109 mmol/L (ref 101–111)
CO2: 16 mmol/L — ABNORMAL LOW (ref 22–32)
Calcium: 9.4 mg/dL (ref 8.9–10.3)
Creatinine, Ser: 1.29 mg/dL — ABNORMAL HIGH (ref 0.61–1.24)
GFR calc Af Amer: >60 mL/min (ref >60)
Glucose, Bld: 194 mg/dL — ABNORMAL HIGH (ref 65–99)
PHOSPHORUS: 3.7 mg/dL (ref 2.5–4.6)
POTASSIUM: 4.4 mmol/L (ref 3.5–5.1)
Sodium: 139 mmol/L (ref 135–145)

## 2015-07-03 LAB — URINALYSIS, ROUTINE W REFLEX MICROSCOPIC
BILIRUBIN URINE: NEGATIVE
Glucose, UA: NEGATIVE mg/dL
Hgb urine dipstick: NEGATIVE
Ketones, ur: NEGATIVE mg/dL
LEUKOCYTES UA: NEGATIVE
NITRITE: NEGATIVE
PH: 5 (ref 5.0–8.0)
Protein, ur: NEGATIVE mg/dL
Specific Gravity, Urine: 1.01 (ref 1.005–1.030)

## 2015-07-03 LAB — I-STAT ARTERIAL BLOOD GAS, ED
ACID-BASE DEFICIT: 15 mmol/L — AB (ref 0.0–2.0)
Acid-base deficit: 17 mmol/L — ABNORMAL HIGH (ref 0.0–2.0)
BICARBONATE: 10.8 meq/L — AB (ref 20.0–24.0)
Bicarbonate: 9.7 mEq/L — ABNORMAL LOW (ref 20.0–24.0)
O2 SAT: 94 %
O2 Saturation: 97 %
PCO2 ART: 25.6 mmHg — AB (ref 35.0–45.0)
PO2 ART: 86 mmHg (ref 80.0–100.0)
TCO2: 12 mmol/L (ref 0–100)
TCO2: 10 mmol/L (ref 0–100)
pCO2 arterial: 25.1 mmHg — ABNORMAL LOW (ref 35.0–45.0)
pH, Arterial: 7.242 — ABNORMAL LOW (ref 7.350–7.450)
pH, Arterial: 7.187 — CL (ref 7.350–7.450)
pO2, Arterial: 103 mmHg — ABNORMAL HIGH (ref 80.0–100.0)

## 2015-07-03 LAB — CBG MONITORING, ED
GLUCOSE-CAPILLARY: 132 mg/dL — AB (ref 65–99)
GLUCOSE-CAPILLARY: 138 mg/dL — AB (ref 65–99)
Glucose-Capillary: 138 mg/dL — ABNORMAL HIGH (ref 65–99)

## 2015-07-03 LAB — ETHYLENE GLYCOL: ETHYLENE GLYCOL LVL: 27 mg/dL — AB

## 2015-07-03 LAB — RAPID URINE DRUG SCREEN, HOSP PERFORMED
AMPHETAMINES: NOT DETECTED
Barbiturates: NOT DETECTED
Benzodiazepines: NOT DETECTED
Cocaine: NOT DETECTED
Opiates: POSITIVE — AB
TETRAHYDROCANNABINOL: NOT DETECTED

## 2015-07-03 LAB — CK: CK TOTAL: 142 U/L (ref 49–397)

## 2015-07-03 LAB — OSMOLALITY: OSMOLALITY: 301 mosm/kg — AB (ref 275–295)

## 2015-07-03 LAB — ETHANOL

## 2015-07-03 LAB — NA AND K (SODIUM & POTASSIUM), RAND UR
Potassium Urine: 32 mmol/L
Sodium, Ur: 77 mmol/L

## 2015-07-03 LAB — SALICYLATE LEVEL: Salicylate Lvl: <4 mg/dL (ref 2.8–30.0)

## 2015-07-03 LAB — CHLORIDE, URINE, RANDOM: Chloride Urine: 59 mmol/L

## 2015-07-03 LAB — LACTIC ACID, PLASMA
LACTIC ACID, VENOUS: 6 mmol/L — AB (ref 0.5–2.0)
LACTIC ACID, VENOUS: 6.3 mmol/L — AB (ref 0.5–2.0)

## 2015-07-03 LAB — ACETAMINOPHEN LEVEL: Acetaminophen (Tylenol), Serum: <10 ug/mL — ABNORMAL LOW (ref 10–30)

## 2015-07-03 LAB — I-STAT CG4 LACTIC ACID, ED: LACTIC ACID, VENOUS: 5.84 mmol/L — AB (ref 0.5–2.0)

## 2015-07-03 MED ORDER — PENTAFLUOROPROP-TETRAFLUOROETH EX AERO: 1.0000 "application " | INHALATION_SPRAY | CUTANEOUS | Status: DC | PRN

## 2015-07-03 MED ORDER — SODIUM CHLORIDE 0.9 % IV SOLN: 100.0000 mL | INTRAVENOUS | Status: DC | PRN

## 2015-07-03 MED ORDER — SODIUM CHLORIDE 0.9 % IV BOLUS (SEPSIS)
1000.0000 mL | Freq: Once | INTRAVENOUS | Status: AC
  Administered 2015-07-03: 1000 mL via INTRAVENOUS

## 2015-07-03 MED ORDER — HEPARIN SODIUM (PORCINE) 5000 UNIT/ML IJ SOLN
5000.0000 [IU] | Freq: Three times a day (TID) | INTRAMUSCULAR | Status: DC
  Administered 2015-07-04 – 2015-07-06 (×8): 5000 [IU] via SUBCUTANEOUS
  Filled 2015-07-03 (×8): qty 1

## 2015-07-03 MED ORDER — INSULIN ASPART 100 UNIT/ML ~~LOC~~ SOLN
0.0000 [IU] | SUBCUTANEOUS | Status: DC
  Administered 2015-07-03 – 2015-07-04 (×2): 3 [IU] via SUBCUTANEOUS
  Administered 2015-07-04: 4 [IU] via SUBCUTANEOUS
  Administered 2015-07-04: 7 [IU] via SUBCUTANEOUS
  Administered 2015-07-04 (×2): 4 [IU] via SUBCUTANEOUS
  Administered 2015-07-04: 11 [IU] via SUBCUTANEOUS
  Administered 2015-07-05: 3 [IU] via SUBCUTANEOUS
  Filled 2015-07-03: qty 1

## 2015-07-03 MED ORDER — ONDANSETRON HCL 4 MG/2ML IJ SOLN
4.0000 mg | Freq: Once | INTRAMUSCULAR | Status: AC
  Administered 2015-07-03: 4 mg via INTRAVENOUS
  Filled 2015-07-03: qty 2

## 2015-07-03 MED ORDER — HYDROMORPHONE HCL 1 MG/ML IJ SOLN
1.0000 mg | INTRAMUSCULAR | Status: DC | PRN
  Administered 2015-07-03 (×2): 1 mg via INTRAVENOUS
  Filled 2015-07-03 (×2): qty 1

## 2015-07-03 MED ORDER — SODIUM CHLORIDE 0.9% FLUSH
3.0000 mL | Freq: Two times a day (BID) | INTRAVENOUS | Status: DC
  Administered 2015-07-04 – 2015-07-06 (×5): 3 mL via INTRAVENOUS

## 2015-07-03 MED ORDER — LIDOCAINE-PRILOCAINE 2.5-2.5 % EX CREA: 1.0000 "application " | TOPICAL_CREAM | CUTANEOUS | Status: DC | PRN

## 2015-07-03 MED ORDER — HEPARIN SODIUM (PORCINE) 1000 UNIT/ML DIALYSIS: 1000.0000 [IU] | INTRAMUSCULAR | Status: DC | PRN

## 2015-07-03 MED ORDER — IOPAMIDOL (ISOVUE-300) INJECTION 61%
INTRAVENOUS | Status: AC
  Filled 2015-07-03: qty 100

## 2015-07-03 MED ORDER — IOPAMIDOL (ISOVUE-370) INJECTION 76%
INTRAVENOUS | Status: AC
  Administered 2015-07-03: 100 mL
  Filled 2015-07-03: qty 100

## 2015-07-03 MED ORDER — ALTEPLASE 2 MG IJ SOLR: 2.0000 mg | Freq: Once | INTRAMUSCULAR | Status: DC | PRN

## 2015-07-03 MED ORDER — HYDROMORPHONE HCL 1 MG/ML IJ SOLN
1.0000 mg | Freq: Once | INTRAMUSCULAR | Status: AC
  Administered 2015-07-03: 1 mg via INTRAVENOUS
  Filled 2015-07-03: qty 1

## 2015-07-03 MED ORDER — SODIUM CHLORIDE 0.9 % IV SOLN
15.0000 mg/kg | INTRAVENOUS | Status: AC
  Administered 2015-07-03: 2350 mg via INTRAVENOUS
  Filled 2015-07-03: qty 2.35

## 2015-07-03 MED ORDER — ACETAMINOPHEN 325 MG PO TABS: 650.0000 mg | ORAL_TABLET | Freq: Four times a day (QID) | ORAL | Status: DC | PRN

## 2015-07-03 MED ORDER — STERILE WATER FOR INJECTION IV SOLN
150.0000 meq | INTRAVENOUS | Status: DC
  Administered 2015-07-03 – 2015-07-05 (×5): 150 meq via INTRAVENOUS
  Filled 2015-07-03 (×11): qty 850

## 2015-07-03 MED ORDER — LIDOCAINE HCL (PF) 1 % IJ SOLN: 5.0000 mL | INTRAMUSCULAR | Status: DC | PRN

## 2015-07-03 MED ORDER — ONDANSETRON HCL 4 MG/2ML IJ SOLN: 4.0000 mg | Freq: Four times a day (QID) | INTRAMUSCULAR | Status: DC | PRN

## 2015-07-03 MED ORDER — ACETAMINOPHEN 650 MG RE SUPP: 650.0000 mg | Freq: Four times a day (QID) | RECTAL | Status: DC | PRN

## 2015-07-03 MED ORDER — LACTATED RINGERS IV SOLN
INTRAVENOUS | Status: DC
  Administered 2015-07-03: 125 mL/h via INTRAVENOUS

## 2015-07-03 NOTE — ED Notes (Signed)
Phlebotomy came for lab but patient still in CT

## 2015-07-03 NOTE — ED Notes (Signed)
Patient provided with Malaw[place]key sandwich and diet coke per MD.

## 2015-07-03 NOTE — Progress Notes (Signed)
Patient reevaluated several times during the day as more information became available after initial admission. A repeat ABG showed worsening acidosis and nephrology was consulted, I discussed with Dr. Arrie Aranoladonato and patient was started on bicarbonate drip. 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 Had a long discussion with the patient after all the results came back, he continues to deny any anti-freeze ingestion. He has a long-standing psychiatric history with several suicide attempts and I feared that he is not being straightforward with the medical team currently and is hiding his ingestion from us. 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 He even mentioned that he would like to be a DO NOT RESUSCITATE and does not wish for any dialysis, however with strong suicidal ideation history he is in need of medical treatment right now without which he is at risk of death. He tells me he does not want to die and wants to get better, and I reinforced that he needs to be compliant with the medical treatment if he did these are his wishes. I consulted psychiatry for further evaluation. PCCM and was also consulted for HD catheter placement per nephrology request.   Yazlyn Wentzel M. Elvera LennoxGherghe, MD [facility] [phone]

## 2015-07-03 NOTE — Progress Notes (Signed)
Critical ABG values called to A. Morrie SheldonAshley, RN at 10:55 by S. Analise Glotfelty, RRT on 07/03/2015.

## 2015-07-03 NOTE — ED Notes (Signed)
First central line insertion unsuccessful due to occluded IJ. MD to obtain chest xray prior to next attempt.

## 2015-07-03 NOTE — ED Notes (Signed)
MD at bedside to obtain central line

## 2015-07-03 NOTE — ED Notes (Signed)
Admitting MD at bedside.

## 2015-07-03 NOTE — ED Notes (Signed)
MD advised BMP to be drawn after fluids complete.

## 2015-07-03 NOTE — Procedures (Signed)
Hemodialysis Catheter Insertion Procedure Note Derrick StampsDuane Austin [id number] 09-20-1960  Procedure: Insertion of Hemodialysis Catheter Indications: Hemodialysis  Procedure Details Consent: Risks of procedure as well as the alternatives and risks of each were explained to the (patient/caregiver).  Consent for procedure obtained.  Time Out: Verified patient identification, verified procedure, site/side was marked, verified correct patient position, special equipment/implants available, medications/allergies/relevent history reviewed, required imaging and test results available.  Performed  Maximum sterile technique was used including antiseptics, cap, gloves, gown, hand hygiene, mask and sheet.  Skin prep: Chlorhexidine; local anesthetic administered  A Trialysis HD catheter was placed in the left internal jugular vein using the Seldinger technique.  Sutured in place.   Previous attempt at R IJ, UNABLE TO PASS WIRE.  CXR reviewed prior to L IJ stick without complication.   Evaluation Blood flow good Complications: No apparent complications Patient did tolerate procedure well. Chest X-ray ordered to verify placement.  CXR: pending.   Procedure performed under direct supervision of Dr. Isaiah Austin and with ultrasound guidance for real time vessel cannulation.     Derrick BrimBrandi Davyn Elsasser, NP-C [facility] Pgr: [phone] or [phone] 07/03/2015, 6:29 PM

## 2015-07-03 NOTE — Progress Notes (Addendum)
Repeat lactic acid 6, CT angiogram of the abdomen and pelvis with no acute abnormalities therefore excluding ischemic bowel as etiology. Stat ethylene glycol level pending. Repeat ABG to be collected.  1115 am: ABG demonstrates worsened pH down to 7.185 w/further bicarb deficit and higher ABE now 17. Dr. Lafe GarinGherge has spoken with Dr. Fidela Juneauoladanato/Renal who recommends BOTH fomepizole and sodium bicarb infusions; he will see the pt in consultation.  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

## 2015-07-03 NOTE — H&P (Signed)
History and Physical    Derrick Austin ZOX:[id number] DOB: 12-20-1960 DOA: 07/03/2015  Referring MD/NP/PA: Bebe Shaggy / ER PCP: Thalia Bloodgood, MD  Outpatient Specialists: IP Psych at [facility] and at [place] Patient coming from: Private residence  Chief Complaint: Acute onset right lower quadrant abdominal pain  HPI: Derrick Austin is a 55 y.o. male with medical history significant for patient reports a history of diabetes and hypertension. He is a poor historian. After review of the electronic medical records through [facility] patient has  documented significant psychiatric history of ongoing severe depression with multiple suicide attempts utilizing ethylene glycol dating back to 2015. He has had 2 attempts utilizing ethylene glycol in the past 2 months. He was hospitalized through [facility] beginning on 3/21 for an ethylene glycol intentional overdose with a peak level of 78. After he recovered medically he was admitted to the inpatient psychiatric unit. Unfortunately patient was readmitted to [facility] in [place], [place] on 4/19 for another intentional ethylene glycol overdose. During the April hospitalization the patient developed severe metabolic acidosis (despite fomepizole infusion) with associated renal failure and required short-term dialysis. Of note patient was not forthcoming with this information during my initial contact with him.   Patient reports that around 5 PM yesterday while sitting in a bookstore reading he developed acute onset of dull with colicky sharp right lower quadrant abdominal pain that has been unrelenting. He reports that he has increased pain with voiding that radiates down the right lower quadrant into the scrotum. He has not noticed any hematuria. He has not had any scrotal swelling. He's had some nausea. He's had no vomiting or diarrhea. He has not noticed blood in his stools. He continues to pass flatus.  He states he has not started any new medications or herbal supplements. He denied to me use of alcohol including homemade forms of alcohol. He reports continuous abdominal pain at this point. He did not volunteer information regarding his most recent hospitalizations. My attending MD returned to the room and obtained an extremely unusual history from the patient. He reports he had ethylene glycol in the car with him and accidentally poured this into his [employer] cup and then drank from the cup. My attending MD questioned the patient regarding the likelihood of this occurring and the patient insisted this is how the events proceeded.  ED Course:  Temp 98.7-BP 141/95-pulse 105-respirations 28-room air saturations 98% Repeat vital signs: BP 130/77-pulse 66-respirations 15 CT renal stone study: No obstructive uropathy urolithiasis or acute intra-abdominal process, mild to moderate amount of retained large bowel stool without obstruction, large right and small left fat-containing inguinal hernias Lab data: ABG: PH 7.24, PCO2 25, PO2 103, bicarbonate 10.8, acid base deficit 15, sodium 136, potassium 4.8, CO2 11, glucose 149, anion gap 19, LFTs are all within normal limits, total CK 142, lactic acid 5.84, acetaminophen level less than 10, salicylate level less than 4, alcohol level less than 5, urine drug screen positive for opiates Zofran 4 mg PO 1 Dilaudid 1 mg 3 total doses Zofran 4 mg IV 1 Normal saline bolus 3 L  Review of Systems:  In addition to the HPI above,  No Fever-chills, myalgias or other constitutional symptoms No Headache, changes with Vision or hearing, new weakness, tingling, numbness in any extremity No problems swallowing food or Liquids, indigestion/reflux No Chest pain, Cough or Shortness of Breath, palpitations, orthopnea or DOE No melena or hematochezia, no dark tarry stools No dysuria,  hematuria or flank pain No new skin rashes, lesions, masses or bruises, No new joints  pains-aches No recent weight gain or loss No polyuria, polydypsia or polyphagia,   Past Medical History  Diagnosis Date  . Diabetes mellitus without complication (HCC)   . Hypertension   . Hyperlipidemia     Past Surgical History  Procedure Laterality Date  . Appendectomy    . Eye surgery    . Ankle surgery    . Toe amputation    . Knee surgery    . Hammer toe surgery      both great toes     reports that he has never smoked. He has never used smokeless tobacco. He reports that he does not drink alcohol or use illicit drugs.  Allergies  Allergen Reactions  . Bactrim [Sulfamethoxazole-Trimethoprim] Hives  . Penicillins Hives    Has patient had a PCN reaction causing immediate rash, facial/tongue/throat swelling, SOB or lightheadedness with hypotension: no Has patient had a PCN reaction causing severe rash involving mucus membranes or skin necrosis: unknown Has patient had a PCN reaction that required hospitalization : no Has patient had a PCN reaction occurring within the last 10 years: no If all of the above answers are "NO", then may proceed with Cephalosporin use.   Marland Kitchen Keflex [Cephalexin] Rash    History reviewed through the electronic medical record and positive for father with history of heart attack   Prior to Admission medications   Medication Sig Start Date End Date Taking? Authorizing Provider  glimepiride (AMARYL) 2 MG tablet Take 2 mg by mouth daily. 12/25/13  Yes Historical Provider, MD  lisinopril-hydrochlorothiazide (PRINZIDE,ZESTORETIC) 20-25 MG tablet Take 1 tablet by mouth daily. 08/26/13  Yes Historical Provider, MD  loratadine (CLARITIN) 10 MG tablet Take 10 mg by mouth daily.   Yes Historical Provider, MD  Multiple Vitamin (MULTIVITAMIN WITH MINERALS) TABS tablet Take 1 tablet by mouth daily.   Yes Historical Provider, MD  sertraline (ZOLOFT) 50 MG tablet Take 150 mg by mouth daily.   Yes Historical Provider, MD  simvastatin (ZOCOR) 20 MG tablet Take  20 mg by mouth daily. 08/31/14  Yes Historical Provider, MD    Physical Exam: Filed Vitals:   07/03/15 0615 07/03/15 0630 07/03/15 0645 07/03/15 0713  BP: 130/75 115/74 125/70 130/77  Pulse: 68 66 63 71  Temp:      TempSrc:      Resp: 17 13 15 16   Height:      Weight:      SpO2: 98% 98% 93% 93%      Constitutional: NAD, calm, comfortable, somewhat flushed very disheveled in appearance Eyes: PERRL, lids and conjunctivae normal ENMT: Mucous membranes are dry. Posterior pharynx clear of any exudate or lesions.Normal dentition.  Neck: normal, supple, no masses, no thyromegaly Respiratory: clear to auscultation bilaterally, no wheezing, no crackles. Normal respiratory effort. No accessory muscle use.  Cardiovascular: Regular rate and rhythm, no murmurs / rubs / gallops. No extremity edema. 2+ pedal pulses. No carotid bruits.  Abdomen: Focal right lower quadrant tenderness with guarding but no rebounding, no masses palpated. No hepatosplenomegaly. Bowel sounds positive but hyperactive.  Musculoskeletal: no clubbing / cyanosis. No joint deformity upper and lower extremities. Good ROM, no contractures. Normal muscle tone. Has undergone the areas toe amputations both feet. Skin: no rashes, lesions, ulcers. No induration Neurologic: CN 2-12 grossly intact. Sensation intact, DTR normal. Strength 5/5 x all 4 extremities.  Psychiatric:  Alert and oriented x 3. Flat affect,  evasive regarding medical history and history of present illness; reported ingestion of antifreeze as "accidental"   Labs on Admission: I have personally reviewed following labs and imaging studies  CBC:  Recent Labs Lab 07/02/15 2205  WBC 8.7  HGB 17.6*  HCT 52.3*  MCV 93.1  PLT 247   Basic Metabolic Panel:  Recent Labs Lab 07/02/15 2205 07/03/15 0305  NA 137 136  K 4.4 4.8  CL 105 106  CO2 14* 11*  GLUCOSE 245* 149*  BUN 18 18  CREATININE 1.21 1.08  CALCIUM 9.6 9.4   GFR: Estimated Creatinine  Clearance: 124 mL/min (by C-G formula based on Cr of 1.08). Liver Function Tests:  Recent Labs Lab 07/02/15 2205 07/03/15 0550  AST 35 26  ALT 42 34  ALKPHOS 72 55  BILITOT 0.8 0.7  PROT 7.1 6.7  ALBUMIN 3.9 3.5    Recent Labs Lab 07/02/15 2205  LIPASE 43   No results for input(s): AMMONIA in the last 168 hours. Coagulation Profile: No results for input(s): INR, PROTIME in the last 168 hours. Cardiac Enzymes:  Recent Labs Lab 07/03/15 0550  CKTOTAL 142   BNP (last 3 results) No results for input(s): PROBNP in the last 8760 hours. HbA1C: No results for input(s): HGBA1C in the last 72 hours. CBG: No results for input(s): GLUCAP in the last 168 hours. Lipid Profile: No results for input(s): CHOL, HDL, LDLCALC, TRIG, CHOLHDL, LDLDIRECT in the last 72 hours. Thyroid Function Tests: No results for input(s): TSH, T4TOTAL, FREET4, T3FREE, THYROIDAB in the last 72 hours. Anemia Panel: No results for input(s): VITAMINB12, FOLATE, FERRITIN, TIBC, IRON, RETICCTPCT in the last 72 hours. Urine analysis:    Component Value Date/Time   COLORURINE YELLOW 07/02/2015 2209   APPEARANCEUR CLOUDY* 07/02/2015 2209   LABSPEC 1.019 07/02/2015 2209   PHURINE 5.5 07/02/2015 2209   GLUCOSEU NEGATIVE 07/02/2015 2209   HGBUR NEGATIVE 07/02/2015 2209   BILIRUBINUR NEGATIVE 07/02/2015 2209   KETONESUR 15* 07/02/2015 2209   PROTEINUR 30* 07/02/2015 2209   NITRITE NEGATIVE 07/02/2015 2209   LEUKOCYTESUR NEGATIVE 07/02/2015 2209   Sepsis Labs: @LABRCNTIP (procalcitonin:4,lacticidven:4) )No results found for this or any previous visit (from the past 240 hour(s)).   Radiological Exams on Admission: Dg Chest Portable 1 View  07/03/2015  CLINICAL DATA:  Cough and shortness of breath tonight EXAM: PORTABLE CHEST 1 VIEW COMPARISON:  05/30/2015 FINDINGS: No cardiomegaly for technique. Stable aortic tortuosity. There is no edema, consolidation, effusion, or pneumothorax. IMPRESSION: Negative  portable chest. Electronically Signed   By: Marnee SpringJonathon  Watts M.D.   On: 07/03/2015 06:00   Ct Renal Stone Study  07/03/2015  CLINICAL DATA:  RIGHT flank pain and nausea beginning last night. History of appendectomy, hypertension, hyperlipidemia and diabetes. EXAM: CT ABDOMEN AND PELVIS WITHOUT CONTRAST TECHNIQUE: Multidetector CT imaging of the abdomen and pelvis was performed following the standard protocol without IV contrast. COMPARISON:  None. FINDINGS: LUNG BASES: Included view of the lung bases are clear. Included heart size is normal. Moderate coronary artery calcifications. No pericardial effusion. KIDNEYS/BLADDER: Kidneys are orthotopic, demonstrating normal size and morphology. No nephrolithiasis, hydronephrosis; limited assessment for renal masses on this nonenhanced examination. The unopacified ureters are normal in course and caliber. Urinary bladder is partially distended and unremarkable. SOLID ORGANS: The liver, spleen, gallbladder, pancreas and adrenal glands are unremarkable for this non-contrast examination. GASTROINTESTINAL TRACT: The stomach, small and large bowel are normal in course and caliber without inflammatory changes, the sensitivity may be decreased by lack of  enteric contrast. Mild to moderate amount of retained large bowel stool. A few scattered colonic diverticula Nonvisualized appendix compatible with provided history. PERITONEUM/RETROPERITONEUM: Aortoiliac vessels are normal in course and caliber, trace calcific atherosclerosis. No lymphadenopathy by CT size criteria. Prostate size is normal. No intraperitoneal free fluid nor free air. SOFT TISSUES/ OSSEOUS STRUCTURES: Nonsuspicious. Large RIGHT and small LEFT fat containing inguinal hernias. Small fat containing umbilical hernia. Multilevel moderate to severe lower lumbar facet arthropathy. Moderate bilateral sacroiliac osteoarthrosis, less likely sacroiliitis. IMPRESSION: No urolithiasis, obstructive uropathy nor acute  intra-abdominal/pelvic process. Mild to moderate amount of retained large bowel stool without bowel obstruction. Large RIGHT and small LEFT fat containing inguinal hernias. Electronically Signed   By: Awilda Metro M.D.   On: 07/03/2015 05:06    EKG: (Independently reviewed) sinus rhythm with ventricular rate 83 bpm, QTC 452 ms, no ischemic changes, prolonged p-r interval consistent with prior EKGs  Assessment/Plan Principal Problem:   Continuous RLQ abdominal pain/flank pain -Likely related to suspected ethylene glycol ingestion but as precaution need to rule out ischemic bowel process -Discussed with radiologist on call regarding previous CT scan and if we have any concerns over low bowel perfusion/bowel wall ischemia need to pursue IV contrasted study which has now been ordered -NPO for now -IV Dilaudid for pain  Active Problems:   Metabolic acidosis, increased anion gap/suspected intentional ethylene glycol overdose -As noted above after review of the EMR patient does have a history of repeated ethylene glycol intentional overdoses for the sole purpose of suicide attempt dating back to 2015 and he has had 2 attempts in the past 2 months (1 in March and 1 just over 1 week ago). He has responded well to fomepizole in the past although during April admission he did require short-term hemodialysis. He also received inpatient psychiatric care during each of those suicide attempts -Chek ethylene glycol level: If elevated will require fomepizole infusion and serial ethylene glycol levels -Repeat ABG now and cycle every 4 hours -Serum lactate may solely be elevated secondary to metabolite from ethylene glycol -Continue LR; may benefit from sodium bicarbonate infusion as well -Renal function is normal and although patient has elevated anion gap CBG currently less than 200 so less likely this is DKA -Hold diuretics and ACE inhibitor -CPK is normal and LFTs are normal -Once medically stable  patient will need formal psychiatric evaluation    Diabetes mellitus type II, uncontrolled  -Based on the EMR patient's most recent hemoglobin A1c has been 11.9 -Only on Amaryl at home -Was given Levemir twice a day during the hospitalization -Check hemoglobin A1c -CBGs every 4 hours while NPO and provide SSI   Depression -Documented history of severe depression -Plan to resume Zoloft once diet resumed    HTN  -Blood pressures currently controlled but will hold medications as above    Constipation -Noted on CT the abdomen and pelvis -Likely may have increased bowel transit after CT contrast -Once diet initiated would begin bowel regimen with combination of stool softeners and laxatives -I suspect patient has low GI transit time related to poorly controlled diabetes and likely evolving GI paresis    Morbid obesity with BMI of 40.0-44.9, adult  -Weight reduction strategies to be discussed by patient's primary care physician    Bilateral inguinal hernia -CT shows stable nonincarcerated hernias      DVT prophylaxis: Subcutaneous heparin  Code Status: DO NOT RESUSCITATE-patient shows me a handwritten note consistent with his wishes  Family Communication: No family at bedside  Disposition Plan: Anticipate will require inpatient psychiatric treatment before discharge back to home Consults called: None Admission status: Stepdown/inpatient Mobility: Without assistive devices   ELLIS,ALLISON L. ANP-BC [facility] Pager [phone]   If 7PM-7AM, please contact night-coverage [email or web address] Password [email or web address]  07/03/2015, 8:16 AM

## 2015-07-03 NOTE — ED Notes (Signed)
Staffing office notified of patient's need for suicide sitter.

## 2015-07-03 NOTE — ED Notes (Signed)
Dr. Wickline at the bedside.  

## 2015-07-03 NOTE — Progress Notes (Signed)
Rec'd report from ED nurse. Patient is on the way to HD. Will be a few hours before arrival to unit.

## 2015-07-03 NOTE — ED Notes (Signed)
Central line cart at bedside 

## 2015-07-03 NOTE — Progress Notes (Signed)
[facility]  He presented with complaints of abdominal pain.  Noted to have an anion gap acidosis and was noted to have recent admission for ethylene glycol ingestion requiring HD.   Mr. Derrick Austin freely admits to the suicide attempt 2 weeks ago but states that he is not suicidal and has not ingested any ethylene glycol or other substances in order to hurt himself.He adamantly denies ingestion yesterday or today. [facility]  He is oriented but lacks insight into ingestion and apparent suicide attempt.  He states he does not want dialysis but does not want to die.  Emergent medical consent applies given patients lack of insight into risk of death with ingestion.     Canary BrimBrandi Loni Delbridge, NP-C [facility] Pgr: [phone] or if no answer [phone] 07/03/2015, 5:48 PM

## 2015-07-03 NOTE — ED Notes (Signed)
This RN went to do an hourly rounding on patient - upon entering room, pt appears distant, flat affect. When asked what was on his mind, pt stated "The doctor accused me of drinking antifreeze recently and I did not... I haven't done that in at least a couple weeks... It just makes me mad that they come in here and tell me what I didn't do." MD made aware.

## 2015-07-03 NOTE — ED Notes (Signed)
Ptcbg 132  

## 2015-07-03 NOTE — Consult Note (Signed)
Reason for Consult:  Acidosis Referring Physician: Elvera Lennox, MD  Derrick Austin is an 55 y.o. male.  HPI: Derrick Austin is a 55yo WM with PMH sig for DM, HTN, depression with h/o previous suicide attempts with ingestion of ethylene glycol (at [facility] 06/22/15 and received HD at that time) who presented to [facility] with c/o abdominal pain.  He reports that he was travelling back from [place] to his home in [place] when he stopped at [facility] to be evaluated.  His work up has been unrevealing (negative CT scan of abd/pelvis and CT angio), however he did have evidence of a gap metabolic acidosis.  We were consulted for further evaluation after Dr. Elvera Austin discovered his recent hospitalization for ethylene glycol poisoning and need for HD at [facility] earlier this month.  Derrick Austin freely admits to the suicide attempt 2 weeks ago but states that he is not suicidal and has not ingested any ethylene glycol or other substances in order to hurt himself.    Trend in Creatinine: CREATININE, SER  Date/Time Value Ref Range Status  07/03/2015 11:23 AM 1.12 0.61 - 1.24 mg/dL Final  16/12/9602 54:09 AM 1.08 0.61 - 1.24 mg/dL Final  81/19/1478 29:56 PM 1.21 0.61 - 1.24 mg/dL Final  21/30/8657 84:69 PM 1.09 0.61 - 1.24 mg/dL Final  62/95/2841 32:44 AM 0.90 0.61 - 1.24 mg/dL Final  03/07/7251 66:44 PM 1.22 0.61 - 1.24 mg/dL Final  03/47/4259 56:38 PM 1.08 0.61 - 1.24 mg/dL Final    PMH:   Past Medical History  Diagnosis Date  . Diabetes mellitus without complication (HCC)   . Hypertension   . Hyperlipidemia   . Previous known suicide attempt   . H/O ethylene glycol poisoning   . Depression, major, in partial remission (HCC)     PSH:   Past Surgical History  Procedure Laterality Date  . Appendectomy    . Eye surgery    . Ankle surgery    . Toe amputation    . Knee surgery    . Hammer toe surgery      both great toes    Allergies:  Allergies  Allergen Reactions  . Bactrim  [Sulfamethoxazole-Trimethoprim] Hives  . Penicillins Hives    Has patient had a PCN reaction causing immediate rash, facial/tongue/throat swelling, SOB or lightheadedness with hypotension: no Has patient had a PCN reaction causing severe rash involving mucus membranes or skin necrosis: unknown Has patient had a PCN reaction that required hospitalization : no Has patient had a PCN reaction occurring within the last 10 years: no If all of the above answers are "NO", then may proceed with Cephalosporin use.   Marland Kitchen Keflex [Cephalexin] Rash    Medications:   Prior to Admission medications   Medication Sig Start Date End Date Taking? Authorizing Provider  glimepiride (AMARYL) 2 MG tablet Take 2 mg by mouth daily. 12/25/13  Yes Historical Provider, MD  lisinopril-hydrochlorothiazide (PRINZIDE,ZESTORETIC) 20-25 MG tablet Take 1 tablet by mouth daily. 08/26/13  Yes Historical Provider, MD  loratadine (CLARITIN) 10 MG tablet Take 10 mg by mouth daily.   Yes Historical Provider, MD  Multiple Vitamin (MULTIVITAMIN WITH MINERALS) TABS tablet Take 1 tablet by mouth daily.   Yes Historical Provider, MD  sertraline (ZOLOFT) 50 MG tablet Take 150 mg by mouth daily.   Yes Historical Provider, MD  simvastatin (ZOCOR) 20 MG tablet Take 20 mg by mouth daily. 08/31/14  Yes Historical Provider, MD    Inpatient medications: . fomepizole (ANTIZOL) IV  15 mg/kg Intravenous NOW  . insulin aspart  0-20 Units Subcutaneous Q4H    Discontinued Meds:   Medications Discontinued During This Encounter  Medication Reason  . iopamidol (ISOVUE-300) 61 % injection Returned to [facility]  . lactated ringers infusion     Social History:  reports that he has never smoked. He has never used smokeless tobacco. He reports that he does not drink alcohol or use illicit drugs.  Family History:  History reviewed. No pertinent family history.  A comprehensive review of systems was negative except for: Gastrointestinal: positive for  abdominal pain and nausea Genitourinary: positive for right flank pain Weight change:   Intake/Output Summary (Last 24 hours) at 07/03/15 1247 Last data filed at 07/03/15 1115  Gross per 24 hour  Intake      0 ml  Output   2050 ml  Net  -2050 ml   BP 119/66 mmHg  Pulse 65  Temp(Src) 98.7 F (37.1 C) (Oral)  Resp 16  Ht  (1.93 m)  Wt 156.689 kg (345 lb 7 oz)  BMI 42.07 kg/m2  SpO2 95% Filed Vitals:   07/03/15 1100 07/03/15 1115 07/03/15 1130 07/03/15 1145  BP: 116/83 130/66 116/72 119/66  Pulse: 79 73 70 65  Temp:      TempSrc:      Resp: Height:      Weight:      SpO2: 95% 96% 91% 95%     General appearance: alert, cooperative, fatigued and no distress Head: Normocephalic, without obvious abnormality, atraumatic Eyes: negative findings: lids and lashes normal, conjunctivae and sclerae normal and corneas clear Neck: no adenopathy, no carotid bruit, no JVD, supple, symmetrical, trachea midline and thyroid not enlarged, symmetric, no tenderness/mass/nodules Resp: clear to auscultation bilaterally Cardio: regular rate and rhythm and no rub GI: obese, +BS, soft, +tenderness to suprapubic region, no guarding or rebound Extremities: edema trace pretibial edema, s/p amputation of left great toe  Labs: Basic Metabolic Panel:  Recent Labs Lab 07/02/15 2205 07/03/15 0305 07/03/15 0550 07/03/15 1123  NA 137 136  --  137  K 4.4 4.8  --  4.6  CL 105 106  --  109  CO2 14* 11*  --  9*  GLUCOSE 245* 149*  --  128*  BUN 18 18  --  17  CREATININE 1.21 1.08  --  1.12  ALBUMIN 3.9  --  3.5  --   CALCIUM 9.6 9.4  --  9.1   Liver Function Tests:  Recent Labs Lab 07/02/15 2205 07/03/15 0550  AST 35 26  ALT 42 34  ALKPHOS 72 55  BILITOT 0.8 0.7  PROT 7.1 6.7  ALBUMIN 3.9 3.5    Recent Labs Lab 07/02/15 2205  LIPASE 43   No results for input(s): AMMONIA in the last 168 hours. CBC:  Recent Labs Lab 07/02/15 2205  WBC 8.7  HGB 17.6*  HCT  52.3*  MCV 93.1  PLT 247   PT/INR: (inr:5) Cardiac Enzymes: ) Recent Labs Lab 07/03/15 0550  CKTOTAL 142   CBG:  Recent Labs Lab 07/03/15 0842  GLUCAP 132*    Iron Studies: No results for input(s): IRON, TIBC, TRANSFERRIN, FERRITIN in the last 168 hours.  Xrays/Other Studies: Dg Chest Portable 1 View  07/03/2015  CLINICAL DATA:  Cough and shortness of breath tonight EXAM: PORTABLE CHEST 1 VIEW COMPARISON:  05/30/2015 FINDINGS: No cardiomegaly for technique. Stable aortic tortuosity. There is no edema, consolidation, effusion, or pneumothorax.  IMPRESSION: Negative portable chest. Electronically Signed   By: Marnee Spring M.D.   On: 07/03/2015 06:00   Ct Renal Stone Study  07/03/2015  CLINICAL DATA:  RIGHT flank pain and nausea beginning last night. History of appendectomy, hypertension, hyperlipidemia and diabetes. EXAM: CT ABDOMEN AND PELVIS WITHOUT CONTRAST TECHNIQUE: Multidetector CT imaging of the abdomen and pelvis was performed following the standard protocol without IV contrast. COMPARISON:  None. FINDINGS: LUNG BASES: Included view of the lung bases are clear. Included heart size is normal. Moderate coronary artery calcifications. No pericardial effusion. KIDNEYS/BLADDER: Kidneys are orthotopic, demonstrating normal size and morphology. No nephrolithiasis, hydronephrosis; limited assessment for renal masses on this nonenhanced examination. The unopacified ureters are normal in course and caliber. Urinary bladder is partially distended and unremarkable. SOLID ORGANS: The liver, spleen, gallbladder, pancreas and adrenal glands are unremarkable for this non-contrast examination. GASTROINTESTINAL TRACT: The stomach, small and large bowel are normal in course and caliber without inflammatory changes, the sensitivity may be decreased by lack of enteric contrast. Mild to moderate amount of retained large bowel stool. A few scattered colonic diverticula Nonvisualized appendix  compatible with provided history. PERITONEUM/RETROPERITONEUM: Aortoiliac vessels are normal in course and caliber, trace calcific atherosclerosis. No lymphadenopathy by CT size criteria. Prostate size is normal. No intraperitoneal free fluid nor free air. SOFT TISSUES/ OSSEOUS STRUCTURES: Nonsuspicious. Large RIGHT and small LEFT fat containing inguinal hernias. Small fat containing umbilical hernia. Multilevel moderate to severe lower lumbar facet arthropathy. Moderate bilateral sacroiliac osteoarthrosis, less likely sacroiliitis. IMPRESSION: No urolithiasis, obstructive uropathy nor acute intra-abdominal/pelvic process. Mild to moderate amount of retained large bowel stool without bowel obstruction. Large RIGHT and small LEFT fat containing inguinal hernias. Electronically Signed   By: Awilda Metro M.D.   On: 07/03/2015 05:06   Ct Angio Abd/pel W/ And/or W/o  07/03/2015  CLINICAL DATA:  RLQ pain with some nausea, concern for ischemic bowel, patient is diabetic Patient has drank ethylene glycol about 2 months ago, EXAM: CTA ABDOMEN AND PELVIS WITHOUT CONTRAST TECHNIQUE: Multidetector CT imaging of the abdomen and pelvis was performed using the standard protocol during bolus administration of intravenous contrast. Multiplanar reconstructed images and MIPs were obtained and reviewed to evaluate the vascular anatomy. CONTRAST:  100 ml isovue 370 COMPARISON:  None. FINDINGS: Mild diffuse hepatic steatosis. No focal hepatic abnormalities. Gallbladder normal. Pancreas and spleen are normal. Adrenal glands are normal. Kidneys are normal. No hydronephrosis. Bladder is normal. Reproductive organs are normal. No significant adenopathy. No ascites. Small fat containing right inguinal hernia. Minimal atherosclerotic calcification abdominal aorta. No aortic dilatation or dissection. Normal opacification of the abdominal aorta as well as of its major branches. Appendix is not identified. There is no evidence of  appendicitis. Large bowel, small bowel, and stomach appear normal. Three vessel coronary artery calcification. Visualized portions of the lung bases are clear. No acute musculoskeletal findings. Review of the MIP images confirms the above findings. IMPRESSION: No acute abnormalities to account for the patient's symptoms. Electronically Signed   By: Esperanza Heir M.D.   On: 07/03/2015 10:20     Assessment/Plan: 1.  Metabolic acidosis- large gap concerning for ethylene glycol ingestion (recurrent issue), however he adamantly denies any ingestion of such substances or suicidal ideation.  He also states that he would never want hemodialysis again.  Unfortunately his previous history was missed until Dr. Charlean Sanfilippo team evaluated him 8 hours after admission.  His bicarb has dropped from 14 to 9 and has significant acidosis.  Recommended stat ethylene  glycol levels and will calculate osmolar gap (whichever is completed first) and will likely need urgent hemodialysis (and psych eval plus 302 if he refuses).   1. Recommend empiric fomepizole until labs are obtained. 2. ICU admission for monitoring. 3. HD catheter placement  2. H/o suicidal attempts- unclear why he was discharged without psych placement following his recurrent episodes of suicide attempts and ingestion of ethylene glycol.  Psych consult pending. 3. Abdominal pain- due to #1 4. HTN-stable 5. DM per primary svc. 6. Disposition- if he did ingest ethylene glycol he will need to be placed in psychiatric institution for further management of his depression and personal safety.   Julien NordmannJoseph A Caiden Monsivais 07/03/2015, 12:47 PM

## 2015-07-03 NOTE — Progress Notes (Addendum)
During HD catheter insertion patient made several comments that were concerning - "I am going to get out of here" & "I am going home just watch" and then laughs.  In questioning what he means, he would not elaborate, only "I am going to get out of here". He also asked questions such as "Are you going to use a scalpel to cut my neck (in reference to HD cath insertion)?  Just one slice and I am gone".   In the limited time I spent with the patient, I am very concerned for his well being and his intent for self harm.  Discussed with RN and TRH MD.    Derrick BrimBrandi Aybree Lanyon, NP-C [facility] Pgr: [phone] or if no answer [phone] 07/03/2015, 6:36 PM

## 2015-07-03 NOTE — ED Notes (Signed)
Central line insertion successful in the Left IJ.

## 2015-07-03 NOTE — ED Provider Notes (Signed)
CSN: [id number]     Arrival date & time 07/02/15  2140 History  By signing my name below, I, Doreatha Martin, attest that this documentation has been prepared under the direction and in the presence of Zadie Rhine, MD. Electronically Signed: Doreatha Martin, ED Scribe. 07/03/2015. 12:48 AM.    Chief Complaint  Patient presents with  . Abdominal Pain  . Nausea   Patient is a 55 y.o. male presenting with abdominal pain. The history is provided by the patient. No language interpreter was used.  Abdominal Pain Pain location:  RLQ Pain quality: aching   Pain radiates to:  R flank Pain severity:  Moderate Onset quality:  Sudden Duration:  8 hours Progression:  Worsening Chronicity:  New Context: not sick contacts   Relieved by:  Nothing Worsened by:  Nothing tried Associated symptoms: dysuria and nausea   Associated symptoms: no chest pain, no fever, no hematuria, no shortness of breath and no vomiting   Risk factors: has not had multiple surgeries    HPI Comments: Derrick Austin is a 55 y.o. male with h/o DM, HTN, HLD who presents to the Emergency Department complaining of moderate, sudden onset, 9/10 RLQ abdominal pain with radiation to the right lower back onset 8 hours ago with associated nausea, dizziness, fatigue, mild dysuria, mild HA. No h/o of similar symptoms or renal calculi. H/o appendectomy. No h/o cholecystectomy. No known sick contacts with similar symptoms. Pt reports adequate food and fluid intake. Denies emesis, CP, fever, SOB, hematuria, testicular pain.   Past Medical History  Diagnosis Date  . Diabetes mellitus without complication (HCC)   . Hypertension   . Hyperlipidemia    Past Surgical History  Procedure Laterality Date  . Appendectomy    . Eye surgery    . Ankle surgery    . Toe amputation    . Knee surgery    . Hammer toe surgery      both great toes   History reviewed. No pertinent family history. Social History  Substance Use Topics  . Smoking status:  Never Smoker   . Smokeless tobacco: Never Used  . Alcohol Use: No    Review of Systems  Constitutional: Negative for fever.  Respiratory: Negative for shortness of breath.   Cardiovascular: Negative for chest pain.  Gastrointestinal: Positive for nausea and abdominal pain. Negative for vomiting.  Genitourinary: Positive for dysuria. Negative for hematuria and testicular pain.  Musculoskeletal: Positive for back pain.  Neurological: Positive for dizziness and headaches.  All other systems reviewed and are negative.  Allergies  Bactrim; Penicillins; and Keflex  Home Medications   Prior to Admission medications   Medication Sig Start Date End Date Taking? Authorizing Provider  glimepiride (AMARYL) 2 MG tablet Take 2 mg by mouth daily. 12/25/13   Historical Provider, MD  lisinopril-hydrochlorothiazide (PRINZIDE,ZESTORETIC) 20-25 MG tablet Take 1 tablet by mouth daily. 08/26/13   Historical Provider, MD  simvastatin (ZOCOR) 20 MG tablet Take 20 mg by mouth daily. 08/31/14   Historical Provider, MD   BP 141/95 mmHg  Pulse 105  Temp(Src) 98.7 F (37.1 C) (Oral)  Resp 28  Ht 6\' 4"  (1.93 m)  Wt 345 lb 7 oz (156.689 kg)  BMI 42.07 kg/m2  SpO2 98% Physical Exam CONSTITUTIONAL: Well developed/well nourished. Pt smells of ketones.  HEAD: Normocephalic/atraumatic EYES: EOMI/PERRL ENMT: Mucous membranes moist NECK: supple no meningeal signs SPINE/BACK:entire spine nontender CV: S1/S2 noted, no murmurs/rubs/gallops noted LUNGS: Lungs are clear to auscultation bilaterally, no apparent distress ABDOMEN:  soft, moderate right mid abdominal tenderness. no rebound or guarding, bowel sounds noted throughout abdomen GU:no cva tenderness NEURO: Pt is awake/alert/appropriate, moves all extremitiesx4.  No facial droop.   EXTREMITIES: pulses normal/equal, full ROM SKIN: warm, color normal PSYCH: no abnormalities of mood noted, alert and oriented to situation   ED Course  Procedures  CRITICAL  CARE Performed by: Joya GaskinsWICKLINE,Jasmeet Manton W Total critical care time: 40 minutes Critical care time was exclusive of separately billable procedures and treating other patients. Critical care was necessary to treat or prevent imminent or life-threatening deterioration. Critical care was time spent personally by me on the following activities: development of treatment plan with patient and/or surrogate as well as nursing, discussions with consultants, evaluation of patient's response to treatment, examination of patient, obtaining history from patient or surrogate, ordering and performing treatments and interventions, ordering and review of laboratory studies, ordering and review of radiographic studies, pulse oximetry and re-evaluation of patient's condition. PATIENT WITH METABOLIC ACIDOSIS HE HAS LACTATE >5 HE REQUIRED MULTIPLE LITERS OF IV FLUIDS HE WILL BE ADMITTED  DIAGNOSTIC STUDIES: Oxygen Saturation is 98% on RA, normal by my interpretation.    COORDINATION OF CARE: 12:43 AM Discussed treatment plan with pt at bedside which includes lab work, CT and pt agreed to plan.  4:09 AM Pt with improved glucose, but bicarb worsening despite IV fluids Unclear cause - pt denies any new meds, and no toxic ingestions and no ASA use CT renal study still pending as pt had flank pain that radiates into lower abdomen 7:04 AM Ct SCAN NEGATIVE FOR ACUTE DISEASE REPEAT EXAM - NO SIGNIFICANT ABDOMINAL TENDERNESS HE IS NOT SEPTIC APPEARING HIS VITALS ARE APPROPRIATE HE APPEARS COMFORTABLE DENIES FEVER HE DOES REPORT COUGH CXR NEGATIVE UNCLEAR CAUSE OF ACIDOSIS WILL ADMIT I HAVE ADDED ON OTHER LABS, INCLUDING ASA/APAP LEVELS D/W DR Madelyn FlavorsONDELL SMITH FOR ADMISSION  Labs Review Labs Reviewed  COMPREHENSIVE METABOLIC PANEL - Abnormal; Notable for the following:    CO2 14 (*)    Glucose, Bld 245 (*)    Anion gap 18 (*)    All other components within normal limits  CBC - Abnormal; Notable for the following:     Hemoglobin 17.6 (*)    HCT 52.3 (*)    All other components within normal limits  URINALYSIS, ROUTINE W REFLEX MICROSCOPIC (NOT [facility]) - Abnormal; Notable for the following:    APPearance CLOUDY (*)    Ketones, ur 15 (*)    Protein, ur 30 (*)    All other components within normal limits  URINE MICROSCOPIC-ADD ON - Abnormal; Notable for the following:    Squamous Epithelial / LPF 0-5 (*)    Bacteria, UA FEW (*)    Crystals CA OXALATE CRYSTALS (*)    All other components within normal limits  BASIC METABOLIC PANEL - Abnormal; Notable for the following:    CO2 11 (*)    Glucose, Bld 149 (*)    Anion gap 19 (*)    All other components within normal limits  URINE RAPID DRUG SCREEN, HOSP PERFORMED - Abnormal; Notable for the following:    Opiates POSITIVE (*)    All other components within normal limits  I-STAT CG4 LACTIC ACID, ED - Abnormal; Notable for the following:    Lactic Acid, Venous 5.84 (*)    All other components within normal limits  I-STAT ARTERIAL BLOOD GAS, ED - Abnormal; Notable for the following:    pH, Arterial 7.242 (*)    pCO2  arterial 25.1 (*)    pO2, Arterial 103.0 (*)    Bicarbonate 10.8 (*)    Acid-base deficit 15.0 (*)    All other components within normal limits  LIPASE, BLOOD  CK  SALICYLATE LEVEL  ACETAMINOPHEN LEVEL  ETHANOL  HEPATIC FUNCTION PANEL    Imaging Review Dg Chest Portable 1 View  07/03/2015  CLINICAL DATA:  Cough and shortness of breath tonight EXAM: PORTABLE CHEST 1 VIEW COMPARISON:  05/30/2015 FINDINGS: No cardiomegaly for technique. Stable aortic tortuosity. There is no edema, consolidation, effusion, or pneumothorax. IMPRESSION: Negative portable chest. Electronically Signed   By: Marnee Spring M.D.   On: 07/03/2015 06:00   Ct Renal Stone Study  07/03/2015  CLINICAL DATA:  RIGHT flank pain and nausea beginning last night. History of appendectomy, hypertension, hyperlipidemia and diabetes. EXAM: CT ABDOMEN AND PELVIS WITHOUT  CONTRAST TECHNIQUE: Multidetector CT imaging of the abdomen and pelvis was performed following the standard protocol without IV contrast. COMPARISON:  None. FINDINGS: LUNG BASES: Included view of the lung bases are clear. Included heart size is normal. Moderate coronary artery calcifications. No pericardial effusion. KIDNEYS/BLADDER: Kidneys are orthotopic, demonstrating normal size and morphology. No nephrolithiasis, hydronephrosis; limited assessment for renal masses on this nonenhanced examination. The unopacified ureters are normal in course and caliber. Urinary bladder is partially distended and unremarkable. SOLID ORGANS: The liver, spleen, gallbladder, pancreas and adrenal glands are unremarkable for this non-contrast examination. GASTROINTESTINAL TRACT: The stomach, small and large bowel are normal in course and caliber without inflammatory changes, the sensitivity may be decreased by lack of enteric contrast. Mild to moderate amount of retained large bowel stool. A few scattered colonic diverticula Nonvisualized appendix compatible with provided history. PERITONEUM/RETROPERITONEUM: Aortoiliac vessels are normal in course and caliber, trace calcific atherosclerosis. No lymphadenopathy by CT size criteria. Prostate size is normal. No intraperitoneal free fluid nor free air. SOFT TISSUES/ OSSEOUS STRUCTURES: Nonsuspicious. Large RIGHT and small LEFT fat containing inguinal hernias. Small fat containing umbilical hernia. Multilevel moderate to severe lower lumbar facet arthropathy. Moderate bilateral sacroiliac osteoarthrosis, less likely sacroiliitis. IMPRESSION: No urolithiasis, obstructive uropathy nor acute intra-abdominal/pelvic process. Mild to moderate amount of retained large bowel stool without bowel obstruction. Large RIGHT and small LEFT fat containing inguinal hernias. Electronically Signed   By: Awilda Metro M.D.   On: 07/03/2015 05:06   I have personally reviewed and evaluated these lab  results as part of my medical decision-making.  Medications  ondansetron (ZOFRAN-ODT) disintegrating tablet 4 mg (4 mg Oral Given 07/02/15 2204)  HYDROmorphone (DILAUDID) injection 1 mg (1 mg Intravenous Given 07/03/15 0123)  ondansetron (ZOFRAN) injection 4 mg (4 mg Intravenous Given 07/03/15 0123)  sodium chloride 0.9 % bolus 1,000 mL (0 mLs Intravenous Stopped 07/03/15 0201)  sodium chloride 0.9 % bolus 1,000 mL (0 mLs Intravenous Stopped 07/03/15 0655)  HYDROmorphone (DILAUDID) injection 1 mg (1 mg Intravenous Given 07/03/15 0201)  sodium chloride 0.9 % bolus 1,000 mL (0 mLs Intravenous Stopped 07/03/15 0655)  HYDROmorphone (DILAUDID) injection 1 mg (1 mg Intravenous Given 07/03/15 0408)    EKG Interpretation  Date/Time:  Sunday July 03 2015 01:31:22 EDT Ventricular Rate:  83 PR Interval:    QRS Duration: 95 QT Interval:  385 QTC Calculation: 452 R Axis:   3 Text Interpretation:  Sinus rhythm Low voltage, precordial leads Consider anterior infarct No significant change since last tracing Confirmed by Bebe Shaggy  MD, Zhania Shaheen ([id number]) on 07/03/2015 1:42:39 AM       MDM   Final diagnoses:  Metabolic acidosis  Abdominal pain, unspecified abdominal location  Dehydration    Nursing notes including past medical history and social history reviewed and considered in documentation xrays/imaging reviewed by myself and considered during evaluation Labs/vital reviewed myself and considered during evaluation    I personally performed the services described in this documentation, which was scribed in my presence. The recorded information has been reviewed and is accurate.       Zadie Rhine, MD 07/03/15 [phone]

## 2015-07-03 NOTE — ED Notes (Signed)
3 units insulin given per sliding scale - meal coverage.

## 2015-07-04 DIAGNOSIS — E1165 Type 2 diabetes mellitus with hyperglycemia: Secondary | ICD-10-CM

## 2015-07-04 LAB — COMPREHENSIVE METABOLIC PANEL
ALBUMIN: 3.6 g/dL (ref 3.5–5.0)
ALK PHOS: 58 U/L (ref 38–126)
ALT: 34 U/L (ref 17–63)
AST: 27 U/L (ref 15–41)
Anion gap: 11 (ref 5–15)
BUN: 13 mg/dL (ref 6–20)
CALCIUM: 8.9 mg/dL (ref 8.9–10.3)
CO2: 20 mmol/L — AB (ref 22–32)
CREATININE: 1.04 mg/dL (ref 0.61–1.24)
Chloride: 107 mmol/L (ref 101–111)
GFR calc Af Amer: >60 mL/min (ref >60)
GFR calc non Af Amer: >60 mL/min (ref >60)
GLUCOSE: 180 mg/dL — AB (ref 65–99)
Potassium: 4.2 mmol/L (ref 3.5–5.1)
SODIUM: 138 mmol/L (ref 135–145)
Total Bilirubin: 0.8 mg/dL (ref 0.3–1.2)
Total Protein: 6.6 g/dL (ref 6.5–8.1)

## 2015-07-04 LAB — CBC
HCT: 47.8 % (ref 39.0–52.0)
HEMOGLOBIN: 16.1 g/dL (ref 13.0–17.0)
MCH: 31 pg (ref 26.0–34.0)
MCHC: 33.7 g/dL (ref 30.0–36.0)
MCV: 91.9 fL (ref 78.0–100.0)
PLATELETS: 172 10*3/uL (ref 150–400)
RBC: 5.2 MIL/uL (ref 4.22–5.81)
RDW: 13.6 % (ref 11.5–15.5)
WBC: 7.4 10*3/uL (ref 4.0–10.5)

## 2015-07-04 LAB — VOLATILES,BLD-ACETONE,ETHANOL,ISOPROP,METHANOL
ACETONE, BLOOD: NEGATIVE % (ref 0.000–0.010)
Ethanol, blood: NEGATIVE % (ref 0.000–0.010)
Isopropanol, blood: NEGATIVE % (ref 0.000–0.010)
METHANOL, BLOOD: NEGATIVE % (ref 0.000–0.010)

## 2015-07-04 LAB — GLUCOSE, CAPILLARY
GLUCOSE-CAPILLARY: 157 mg/dL — AB (ref 65–99)
GLUCOSE-CAPILLARY: 202 mg/dL — AB (ref 65–99)
Glucose-Capillary: 165 mg/dL — ABNORMAL HIGH (ref 65–99)
Glucose-Capillary: 161 mg/dL — ABNORMAL HIGH (ref 65–99)
Glucose-Capillary: 169 mg/dL — ABNORMAL HIGH (ref 65–99)

## 2015-07-04 LAB — HEMOGLOBIN A1C
Hgb A1c MFr Bld: 9.4 % — ABNORMAL HIGH (ref 4.8–5.6)
Mean Plasma Glucose: 223 mg/dL

## 2015-07-04 LAB — MRSA PCR SCREENING: MRSA by PCR: NEGATIVE

## 2015-07-04 LAB — ETHYLENE GLYCOL: ETHYLENE GLYCOL LVL: 8 mg/dL — AB

## 2015-07-04 NOTE — Progress Notes (Signed)
PROGRESS NOTE    Derrick Austin  ZOX:[id number] DOB: 1960/07/19 DOA: 07/03/2015 PCP: Thalia Bloodgood, MD     Brief Narrative: 55 year old male with history of recurrent antifreeze poisoning, multiple hospitalizations in 2015 and 2016, including one just a week ago at [facility] with same, comes to the ED with a chief complaint of right flank pain. He was admitted for for mixed AG metabolic acidosis with respiratory alkalosis, possibly anti freeze ingestion and underwent HD treatment. He also make some comments suggestive of intentional self harm and psychiatry consulted.   Assessment & Plan:   Principal Problem:   Continuous RLQ abdominal pain Active Problems:   Metabolic acidosis, increased anion gap   Diabetes mellitus type II, uncontrolled (HCC)   Morbid obesity with BMI of 40.0-44.9, adult (HCC)   HTN (hypertension)   Bilateral inguinal hernia   Constipation   Depression   RIGHT FLANK PAIN: Improved.  Unclear etiology.  Resume diet.   Metabolic acidosis suspected ethylene glycol overdose: - HD treatment last night.  Monitor renal parameters and electrolytes.  ag is closed, bicarb is 20 and creatinine improved.    Suicidal ideation?  He currently denies any suicidal ideation. Called psychiatry consult for further evaluation.    Hypertension: Controlled.    Constipation: Stool softeners.    Morbid obesity.    DVT prophylaxis: sq heparin Code Status: full code Family Communication: none at bedside, and discussed thep lan of car with him Disposition Plan: pending further investigation.    Consultants:   Renal  pccm for HD catheter placement  Psychiatry.   Procedures: HD treatment.   Antimicrobials: none   Subjective: Reports he is not suicidal and doesn't know why he has a sitter at bedside. Explained to the patient.   Objective: Filed Vitals:   07/04/15 1300 07/04/15 1400 07/04/15 1553 07/04/15 1600  BP: 145/100 142/93 150/87 141/87   Pulse: 86 93 88 92  Temp:   98 F (36.7 C)   TempSrc:   Oral   Resp: 19 18 19 21   Height:      Weight:      SpO2: 94% 93% 93% 91%    Intake/Output Summary (Last 24 hours) at 07/04/15 1755 Last data filed at 07/04/15 1400  Gross per 24 hour  Intake   1598 ml  Output   3025 ml  Net  -1427 ml   Filed Weights   07/02/15 2157 07/04/15 0432  Weight: 156.689 kg (345 lb 7 oz) 149.9 kg (330 lb 7.5 oz)    Examination:  General exam: Appears calm and comfortable  Respiratory system: diminished at bases due to body habitus. Respiratory effort normal. Cardiovascular system: S1 & S2 heard, RRR. No JVD, murmurs, rubs, gallops or clicks. No pedal edema. Gastrointestinal system: Abdomen is nondistended, soft and non tender, obese. No organomegaly or masses felt. Normal bowel sounds heard. Central nervous system: Alert and oriented. No focal neurological deficits.  Skin: No rashes, lesions or ulcers     Data Reviewed: I have personally reviewed following labs and imaging studies  CBC:  Recent Labs Lab 07/02/15 2205 07/03/15 1936 07/04/15 0451  WBC 8.7 8.1 7.4  HGB 17.6* 15.8 16.1  HCT 52.3* 48.7 47.8  MCV 93.1 93.8 91.9  PLT 247 192 172   Basic Metabolic Panel:  Recent Labs Lab 07/02/15 2205 07/03/15 0305 07/03/15 1123 07/03/15 1936 07/04/15 0451  NA 137 136 137 139 138  K 4.4 4.8 4.6 4.4 4.2  CL 105 106 109 109 107  CO2 14* 11* 9* 16* 20*  GLUCOSE 245* 149* 128* 194* 180*  BUN 18 18 17 15 13   CREATININE 1.21 1.08 1.12 1.29* 1.04  CALCIUM 9.6 9.4 9.1 9.4 8.9  PHOS  --   --   --  3.7  --    GFR: Estimated Creatinine Clearance: 125.6 mL/min (by C-G formula based on Cr of 1.04). Liver Function Tests:  Recent Labs Lab 07/02/15 2205 07/03/15 0550 07/03/15 1936 07/04/15 0451  AST 35 26  --  27  ALT 42 34  --  34  ALKPHOS 72 55  --  58  BILITOT 0.8 0.7  --  0.8  PROT 7.1 6.7  --  6.6  ALBUMIN 3.9 3.5 3.5 3.6    Recent Labs Lab 07/02/15 2205  LIPASE  43   No results for input(s): AMMONIA in the last 168 hours. Coagulation Profile: No results for input(s): INR, PROTIME in the last 168 hours. Cardiac Enzymes:  Recent Labs Lab 07/03/15 0550  CKTOTAL 142   BNP (last 3 results) No results for input(s): PROBNP in the last 8760 hours. HbA1C:  Recent Labs  07/03/15 1019  HGBA1C 9.4*   CBG:  Recent Labs Lab 07/04/15 0242 07/04/15 0350 07/04/15 0809 07/04/15 1204 07/04/15 1552  GLUCAP 165* 161* 169* 157* 202*   Lipid Profile: No results for input(s): CHOL, HDL, LDLCALC, TRIG, CHOLHDL, LDLDIRECT in the last 72 hours. Thyroid Function Tests: No results for input(s): TSH, T4TOTAL, FREET4, T3FREE, THYROIDAB in the last 72 hours. Anemia Panel: No results for input(s): VITAMINB12, FOLATE, FERRITIN, TIBC, IRON, RETICCTPCT in the last 72 hours. Urine analysis:    Component Value Date/Time   COLORURINE YELLOW 07/03/2015 0835   APPEARANCEUR CLEAR 07/03/2015 0835   LABSPEC 1.010 07/03/2015 0835   PHURINE 5.0 07/03/2015 0835   GLUCOSEU NEGATIVE 07/03/2015 0835   HGBUR NEGATIVE 07/03/2015 0835   BILIRUBINUR NEGATIVE 07/03/2015 0835   KETONESUR NEGATIVE 07/03/2015 0835   PROTEINUR NEGATIVE 07/03/2015 0835   NITRITE NEGATIVE 07/03/2015 0835   LEUKOCYTESUR NEGATIVE 07/03/2015 0835   Sepsis Labs: @LABRCNTIP (procalcitonin:4,lacticidven:4)  ) Recent Results (from the past 240 hour(s))  Urine culture     Status: None (Preliminary result)   Collection Time: 07/03/15  8:35 AM  Result Value Ref Range Status   Specimen Description URINE, RANDOM  Final   Special Requests NONE  Final   Culture TOO YOUNG TO READ  Final   Report Status PENDING  Incomplete  MRSA PCR Screening     Status: None   Collection Time: 07/04/15  1:59 AM  Result Value Ref Range Status   MRSA by PCR NEGATIVE NEGATIVE Final    Comment:        The GeneXpert MRSA Assay (FDA approved for NASAL specimens only), is one component of a comprehensive MRSA  colonization surveillance program. It is not intended to diagnose MRSA infection nor to guide or monitor treatment for MRSA infections.          Radiology Studies: Dg Chest Port 1 View  07/03/2015  CLINICAL DATA:  Central line placement EXAM: PORTABLE CHEST 1 VIEW COMPARISON:  07/03/2015 at 17:47 FINDINGS: There is a new left jugular central line with tip in the expected location of the SVC just below the azygos vein junction. There is no pneumothorax. The lungs are clear. No other significant interval change. IMPRESSION: New left jugular central line.  No pneumothorax. Electronically Signed   By: Ellery Plunk M.D.   On: 07/03/2015 19:02   Dg  Chest Port 1 View  07/03/2015  CLINICAL DATA:  Attempted central line placement EXAM: PORTABLE CHEST 1 VIEW COMPARISON:  07/03/2015 FINDINGS: No IJ catheter visualize. No pneumothorax after attempted catheter placement. The heart size and mediastinal contours are within normal limits. Both lungs are clear. The visualized skeletal structures are unremarkable. IMPRESSION: No pneumothorax identified after attempted right IJ catheter placement. Electronically Signed   By: Signa Kellaylor  Stroud M.D.   On: 07/03/2015 17:58   Dg Chest Portable 1 View  07/03/2015  CLINICAL DATA:  Cough and shortness of breath tonight EXAM: PORTABLE CHEST 1 VIEW COMPARISON:  05/30/2015 FINDINGS: No cardiomegaly for technique. Stable aortic tortuosity. There is no edema, consolidation, effusion, or pneumothorax. IMPRESSION: Negative portable chest. Electronically Signed   By: Marnee SpringJonathon  Watts M.D.   On: 07/03/2015 06:00   Ct Renal Stone Study  07/03/2015  CLINICAL DATA:  RIGHT flank pain and nausea beginning last night. History of appendectomy, hypertension, hyperlipidemia and diabetes. EXAM: CT ABDOMEN AND PELVIS WITHOUT CONTRAST TECHNIQUE: Multidetector CT imaging of the abdomen and pelvis was performed following the standard protocol without IV contrast. COMPARISON:  None.  FINDINGS: LUNG BASES: Included view of the lung bases are clear. Included heart size is normal. Moderate coronary artery calcifications. No pericardial effusion. KIDNEYS/BLADDER: Kidneys are orthotopic, demonstrating normal size and morphology. No nephrolithiasis, hydronephrosis; limited assessment for renal masses on this nonenhanced examination. The unopacified ureters are normal in course and caliber. Urinary bladder is partially distended and unremarkable. SOLID ORGANS: The liver, spleen, gallbladder, pancreas and adrenal glands are unremarkable for this non-contrast examination. GASTROINTESTINAL TRACT: The stomach, small and large bowel are normal in course and caliber without inflammatory changes, the sensitivity may be decreased by lack of enteric contrast. Mild to moderate amount of retained large bowel stool. A few scattered colonic diverticula Nonvisualized appendix compatible with provided history. PERITONEUM/RETROPERITONEUM: Aortoiliac vessels are normal in course and caliber, trace calcific atherosclerosis. No lymphadenopathy by CT size criteria. Prostate size is normal. No intraperitoneal free fluid nor free air. SOFT TISSUES/ OSSEOUS STRUCTURES: Nonsuspicious. Large RIGHT and small LEFT fat containing inguinal hernias. Small fat containing umbilical hernia. Multilevel moderate to severe lower lumbar facet arthropathy. Moderate bilateral sacroiliac osteoarthrosis, less likely sacroiliitis. IMPRESSION: No urolithiasis, obstructive uropathy nor acute intra-abdominal/pelvic process. Mild to moderate amount of retained large bowel stool without bowel obstruction. Large RIGHT and small LEFT fat containing inguinal hernias. Electronically Signed   By: Awilda Metroourtnay  Bloomer M.D.   On: 07/03/2015 05:06   Ct Angio Abd/pel W/ And/or W/o  07/03/2015  CLINICAL DATA:  RLQ pain with some nausea, concern for ischemic bowel, patient is diabetic Patient has drank ethylene glycol about 2 months ago, EXAM: CTA ABDOMEN AND  PELVIS WITHOUT CONTRAST TECHNIQUE: Multidetector CT imaging of the abdomen and pelvis was performed using the standard protocol during bolus administration of intravenous contrast. Multiplanar reconstructed images and MIPs were obtained and reviewed to evaluate the vascular anatomy. CONTRAST:  100 ml isovue 370 COMPARISON:  None. FINDINGS: Mild diffuse hepatic steatosis. No focal hepatic abnormalities. Gallbladder normal. Pancreas and spleen are normal. Adrenal glands are normal. Kidneys are normal. No hydronephrosis. Bladder is normal. Reproductive organs are normal. No significant adenopathy. No ascites. Small fat containing right inguinal hernia. Minimal atherosclerotic calcification abdominal aorta. No aortic dilatation or dissection. Normal opacification of the abdominal aorta as well as of its major branches. Appendix is not identified. There is no evidence of appendicitis. Large bowel, small bowel, and stomach appear normal. Three vessel coronary artery calcification.  Visualized portions of the lung bases are clear. No acute musculoskeletal findings. Review of the MIP images confirms the above findings. IMPRESSION: No acute abnormalities to account for the patient's symptoms. Electronically Signed   By: Esperanza Heir M.D.   On: 07/03/2015 10:20        Scheduled Meds: . heparin  5,000 Units Subcutaneous Q8H  . insulin aspart  0-20 Units Subcutaneous Q4H  . sodium chloride flush  3 mL Intravenous Q12H   Continuous Infusions: .  sodium bicarbonate 150 mEq in sterile water 1000 mL infusion 150 mEq (07/04/15 1208)     LOS: 1 day    Time spent: 30 min    Emmelia Holdsworth, MD [facility] Pager [phone]   If 7PM-7AM, please contact night-coverage [email or web address] Password [email or web address] 07/04/2015, 5:55 PM

## 2015-07-04 NOTE — Progress Notes (Signed)
Subjective: Received HD yesterday. States that he only drank soda and water yesterday. Abdominal pain improved.   Objective Vital signs in last 24 hours: Filed Vitals:   07/04/15 0101 07/04/15 0400 07/04/15 0432 07/04/15 0800  BP: 136/96 149/95  149/95  Pulse: 74 76  82  Temp: 98 F (36.7 C) 98.1 F (36.7 C)  97.5 F (36.4 C)  TempSrc: Oral Oral  Oral  Resp: Height:      Weight:   330 lb 7.5 oz (149.9 kg)   SpO2: 94% 95%  94%   Weight change: -14 lb 15.5 oz (-6.789 kg)  Intake/Output Summary (Last 24 hours) at 07/04/15 1037 Last data filed at 07/04/15 0400  Gross per 24 hour  Intake      3 ml  Output   4500 ml  Net  -4497 ml    Assessment/ Plan: Pt is a 55 y.o. yo male who was admitted on 07/03/2015 with metabolic acidosis, likely 2/2 ethylene glycol ingestion.  Assessment/Plan: 1. Metabolic acidosis 2/2 Ethylene glycol ingestion-ethylene glycol level 27 on admission, however he adamantly denies any ingestion of such substances or suicidal ideation.Received empiric fomepizole yesterday. Also had emergent HD. Blood volatiles levels negative after HD, ethylene glycol level pending.  1. On bicarb drip, improving after HD 2. H/o suicidal attempts- unclear why he was discharged without psych placement following his recurrent episodes of suicide attempts and ingestion of ethylene glycol. Psych consult pending. 3. Abdominal pain- improved, per primary service 4. HTN-stable 5. DM per primary svc. 6. Disposition- psych consult pending  Labs: Basic Metabolic Panel:  Recent Labs Lab 07/03/15 1123 07/03/15 1936 07/04/15 0451  NA 137 139 138  K 4.6 4.4 4.2  CL 109 109 107  CO2 9* 16* 20*  GLUCOSE 128* 194* 180*  BUN CREATININE 1.12 1.29* 1.04  CALCIUM 9.1 9.4 8.9  PHOS  --  3.7  --    Liver Function Tests:  Recent Labs Lab 07/02/15 2205 07/03/15 0550 07/03/15 1936 07/04/15 0451  AST 35 26  --  27  ALT 42 34  --  34  ALKPHOS 72 55  --  58   BILITOT 0.8 0.7  --  0.8  PROT 7.1 6.7  --  6.6  ALBUMIN 3.9 3.5 3.5 3.6    Recent Labs Lab 07/02/15 2205  LIPASE 43   No results for input(s): AMMONIA in the last 168 hours. CBC:  Recent Labs Lab 07/02/15 2205 07/03/15 1936 07/04/15 0451  WBC 8.7 8.1 7.4  HGB 17.6* 15.8 16.1  HCT 52.3* 48.7 47.8  MCV 93.1 93.8 91.9  PLT 247 192 172   Cardiac Enzymes:  Recent Labs Lab 07/03/15 0550  CKTOTAL 142   CBG:  Recent Labs Lab 07/03/15 0842 07/03/15 1518 07/03/15 1621 07/04/15 0242 07/04/15 0350  GLUCAP 132* 138* 138* 165* 161*    Iron Studies: No results for input(s): IRON, TIBC, TRANSFERRIN, FERRITIN in the last 72 hours. Studies/Results: Dg Chest Port 1 View  07/03/2015  CLINICAL DATA:  Central line placement EXAM: PORTABLE CHEST 1 VIEW COMPARISON:  07/03/2015 at 17:47 FINDINGS: There is a new left jugular central line with tip in the expected location of the SVC just below the azygos vein junction. There is no pneumothorax. The lungs are clear. No other significant interval change. IMPRESSION: New left jugular central line.  No pneumothorax. Electronically Signed   By: Ellery Plunk M.D.   On: 07/03/2015 19:02  Dg Chest Port 1 View  07/03/2015  CLINICAL DATA:  Attempted central line placement EXAM: PORTABLE CHEST 1 VIEW COMPARISON:  07/03/2015 FINDINGS: No IJ catheter visualize. No pneumothorax after attempted catheter placement. The heart size and mediastinal contours are within normal limits. Both lungs are clear. The visualized skeletal structures are unremarkable. IMPRESSION: No pneumothorax identified after attempted right IJ catheter placement. Electronically Signed   By: Signa Kell M.D.   On: 07/03/2015 17:58   Dg Chest Portable 1 View  07/03/2015  CLINICAL DATA:  Cough and shortness of breath tonight EXAM: PORTABLE CHEST 1 VIEW COMPARISON:  05/30/2015 FINDINGS: No cardiomegaly for technique. Stable aortic tortuosity. There is no edema, consolidation,  effusion, or pneumothorax. IMPRESSION: Negative portable chest. Electronically Signed   By: Marnee Spring M.D.   On: 07/03/2015 06:00   Ct Renal Stone Study  07/03/2015  CLINICAL DATA:  RIGHT flank pain and nausea beginning last night. History of appendectomy, hypertension, hyperlipidemia and diabetes. EXAM: CT ABDOMEN AND PELVIS WITHOUT CONTRAST TECHNIQUE: Multidetector CT imaging of the abdomen and pelvis was performed following the standard protocol without IV contrast. COMPARISON:  None. FINDINGS: LUNG BASES: Included view of the lung bases are clear. Included heart size is normal. Moderate coronary artery calcifications. No pericardial effusion. KIDNEYS/BLADDER: Kidneys are orthotopic, demonstrating normal size and morphology. No nephrolithiasis, hydronephrosis; limited assessment for renal masses on this nonenhanced examination. The unopacified ureters are normal in course and caliber. Urinary bladder is partially distended and unremarkable. SOLID ORGANS: The liver, spleen, gallbladder, pancreas and adrenal glands are unremarkable for this non-contrast examination. GASTROINTESTINAL TRACT: The stomach, small and large bowel are normal in course and caliber without inflammatory changes, the sensitivity may be decreased by lack of enteric contrast. Mild to moderate amount of retained large bowel stool. A few scattered colonic diverticula Nonvisualized appendix compatible with provided history. PERITONEUM/RETROPERITONEUM: Aortoiliac vessels are normal in course and caliber, trace calcific atherosclerosis. No lymphadenopathy by CT size criteria. Prostate size is normal. No intraperitoneal free fluid nor free air. SOFT TISSUES/ OSSEOUS STRUCTURES: Nonsuspicious. Large RIGHT and small LEFT fat containing inguinal hernias. Small fat containing umbilical hernia. Multilevel moderate to severe lower lumbar facet arthropathy. Moderate bilateral sacroiliac osteoarthrosis, less likely sacroiliitis. IMPRESSION: No  urolithiasis, obstructive uropathy nor acute intra-abdominal/pelvic process. Mild to moderate amount of retained large bowel stool without bowel obstruction. Large RIGHT and small LEFT fat containing inguinal hernias. Electronically Signed   By: Awilda Metro M.D.   On: 07/03/2015 05:06   Ct Angio Abd/pel W/ And/or W/o  07/03/2015  CLINICAL DATA:  RLQ pain with some nausea, concern for ischemic bowel, patient is diabetic Patient has drank ethylene glycol about 2 months ago, EXAM: CTA ABDOMEN AND PELVIS WITHOUT CONTRAST TECHNIQUE: Multidetector CT imaging of the abdomen and pelvis was performed using the standard protocol during bolus administration of intravenous contrast. Multiplanar reconstructed images and MIPs were obtained and reviewed to evaluate the vascular anatomy. CONTRAST:  100 ml isovue 370 COMPARISON:  None. FINDINGS: Mild diffuse hepatic steatosis. No focal hepatic abnormalities. Gallbladder normal. Pancreas and spleen are normal. Adrenal glands are normal. Kidneys are normal. No hydronephrosis. Bladder is normal. Reproductive organs are normal. No significant adenopathy. No ascites. Small fat containing right inguinal hernia. Minimal atherosclerotic calcification abdominal aorta. No aortic dilatation or dissection. Normal opacification of the abdominal aorta as well as of its major branches. Appendix is not identified. There is no evidence of appendicitis. Large bowel, small bowel, and stomach appear normal. Three vessel coronary artery  calcification. Visualized portions of the lung bases are clear. No acute musculoskeletal findings. Review of the MIP images confirms the above findings. IMPRESSION: No acute abnormalities to account for the patient's symptoms. Electronically Signed   By: Esperanza Heiraymond  Rubner M.D.   On: 07/03/2015 10:20   Medications: Infusions: .  sodium bicarbonate 150 mEq in sterile water 1000 mL infusion 150 mEq (07/04/15 0245)    Scheduled Medications: . heparin  5,000  Units Subcutaneous Q8H  . insulin aspart  0-20 Units Subcutaneous Q4H  . sodium chloride flush  3 mL Intravenous Q12H    have reviewed scheduled and prn medications.  Physical Exam: General: Alert, cooperative, no distress Heart: RRR, no murmurs Lungs:CTAB Abdomen:Obesem +BS, S, NT, ND Extremities: Trace bilateral pretibial edema Dialysis Access: trialysis HD catheter in left IJ  Caleb M. Jimmey RalphParker, MD [facility] Resident PGY-2 07/04/2015 10:38 AM    Renal Attending: Falling ethylene glycol level to 8 after treatment with HD and fomepizole.  Pr denying EG ingestion, acidosis better, clear mentation. WIll not repeat HD Glennis Montenegro C

## 2015-07-04 NOTE — Progress Notes (Signed)
Chaplain stopped by at 1:30pm to talk with Derrick Austin. The Pt. [place] SpanielSaid that he would like if chaplain visited later. Chaplain will come back around 4:00pm

## 2015-07-04 NOTE — Progress Notes (Signed)
Chaplain stopped by this morning and the Pt. Was asleep. Chaplain will try again to engage Pt. Around noon time

## 2015-07-04 NOTE — Plan of Care (Signed)
Problem: Diagnosis: Increased Risk For Suicide Attempt Goal: LTG-Patient Family Informed of Increased Suicide Risk LTG (by discharge) Patient's family or significant other informed of patient's increased risk for suicide and they will be able to verbalize ways they can help the patient stay safe after discharge. Outcome: Not Progressing No Family on file Goal: LTG-Patient Will Report Improved Mood and Deny Suicidal LTG (by discharge) Patient will report improved mood and deny suicidal ideation. Outcome: Progressing Patient says he was not trying to harm himself

## 2015-07-05 LAB — GLUCOSE, CAPILLARY
GLUCOSE-CAPILLARY: 253 mg/dL — AB (ref 65–99)
GLUCOSE-CAPILLARY: 114 mg/dL — AB (ref 65–99)
GLUCOSE-CAPILLARY: 178 mg/dL — AB (ref 65–99)
GLUCOSE-CAPILLARY: 233 mg/dL — AB (ref 65–99)
Glucose-Capillary: 118 mg/dL — ABNORMAL HIGH (ref 65–99)
Glucose-Capillary: 144 mg/dL — ABNORMAL HIGH (ref 65–99)
Glucose-Capillary: 142 mg/dL — ABNORMAL HIGH (ref 65–99)

## 2015-07-05 LAB — HEPATITIS B CORE ANTIBODY, TOTAL: Hep B Core Total Ab: NEGATIVE

## 2015-07-05 LAB — URINE CULTURE

## 2015-07-05 LAB — LACTIC ACID, PLASMA: Lactic Acid, Venous: 2.4 mmol/L (ref 0.5–2.0)

## 2015-07-05 LAB — RENAL FUNCTION PANEL
ALBUMIN: 3.1 g/dL — AB (ref 3.5–5.0)
Anion gap: 12 (ref 5–15)
BUN: 13 mg/dL (ref 6–20)
CALCIUM: 8.5 mg/dL — AB (ref 8.9–10.3)
CO2: 28 mmol/L (ref 22–32)
CREATININE: 1.01 mg/dL (ref 0.61–1.24)
Chloride: 98 mmol/L — ABNORMAL LOW (ref 101–111)
GFR calc Af Amer: >60 mL/min (ref >60)
GFR calc non Af Amer: >60 mL/min (ref >60)
Glucose, Bld: 152 mg/dL — ABNORMAL HIGH (ref 65–99)
PHOSPHORUS: 2.6 mg/dL (ref 2.5–4.6)
Potassium: 3.2 mmol/L — ABNORMAL LOW (ref 3.5–5.1)
SODIUM: 138 mmol/L (ref 135–145)

## 2015-07-05 LAB — HEPATITIS B SURFACE ANTIBODY,QUALITATIVE: HEP B S AB: NONREACTIVE

## 2015-07-05 LAB — HEPATITIS B SURFACE ANTIGEN: HEP B S AG: NEGATIVE

## 2015-07-05 MED ORDER — SODIUM CHLORIDE 0.9 % IV SOLN: INTRAVENOUS | Status: DC

## 2015-07-05 MED ORDER — LISINOPRIL 20 MG PO TABS
20.0000 mg | ORAL_TABLET | Freq: Every day | ORAL | Status: DC
  Administered 2015-07-05 – 2015-07-06 (×2): 20 mg via ORAL
  Filled 2015-07-05 (×2): qty 1

## 2015-07-05 MED ORDER — INSULIN ASPART 100 UNIT/ML ~~LOC~~ SOLN
0.0000 [IU] | Freq: Three times a day (TID) | SUBCUTANEOUS | Status: DC
  Administered 2015-07-05: 2 [IU] via SUBCUTANEOUS
  Administered 2015-07-05: 3 [IU] via SUBCUTANEOUS
  Administered 2015-07-06 (×2): 2 [IU] via SUBCUTANEOUS

## 2015-07-05 MED ORDER — INSULIN DETEMIR 100 UNIT/ML ~~LOC~~ SOLN
8.0000 [IU] | Freq: Every day | SUBCUTANEOUS | Status: DC
  Administered 2015-07-05 – 2015-07-06 (×2): 8 [IU] via SUBCUTANEOUS
  Filled 2015-07-05 (×2): qty 0.08

## 2015-07-05 MED ORDER — POTASSIUM CHLORIDE CRYS ER 20 MEQ PO TBCR
40.0000 meq | EXTENDED_RELEASE_TABLET | Freq: Two times a day (BID) | ORAL | Status: AC
  Administered 2015-07-05 (×2): 40 meq via ORAL
  Filled 2015-07-05 (×2): qty 2

## 2015-07-05 MED ORDER — INSULIN ASPART 100 UNIT/ML ~~LOC~~ SOLN
0.0000 [IU] | Freq: Every day | SUBCUTANEOUS | Status: DC
  Administered 2015-07-05: 2 [IU] via SUBCUTANEOUS

## 2015-07-05 NOTE — Progress Notes (Signed)
Subjective: Abdominal pain resolved. No acute complaints this morning.   Objective Vital signs in last 24 hours: Filed Vitals:   07/05/15 0331 07/05/15 0500 07/05/15 0600 07/05/15 0743  BP: 152/91  148/96 160/82  Pulse: 67  61 68  Temp: 98 F (36.7 C)   97.9 F (36.6 C)  TempSrc: Oral   Oral  Resp: Height:      Weight:  344 lb 8 oz (156.264 kg)    SpO2: 94%  94% 95%   Weight change: 14 lb 0.5 oz (6.364 kg)  Intake/Output Summary (Last 24 hours) at 07/05/15 0846 Last data filed at 07/05/15 0600  Gross per 24 hour  Intake   4170 ml  Output   1050 ml  Net   3120 ml    Assessment/ Plan: Pt is a 55 y.o. yo male who was admitted on 07/03/2015 with metabolic acidosis, likely 2/2 ethylene glycol ingestion.  Assessment/Plan: 1. Metabolic acidosis 2/2 Ethylene glycol ingestion-ethylene glycol level 27 on admission, repeat level 8 yesterday after fomepizole and HD.   1. Does not need further HD 2. Will stop bicarb drip 2. H/o suicidal attempts- Psych consult pending 3. Abdominal pain- improved, per primary service 4. HTN-stable 5. DM per primary svc.  Renal Attending OD resolved. No need for further HD or medical tx.  Psyche eval to be pursued. We will sign off. Tamirah George C   Labs: Basic Metabolic Panel:  Recent Labs Lab 07/03/15 1123 07/03/15 1936 07/04/15 0451  NA 137 139 138  K 4.6 4.4 4.2  CL 109 109 107  CO2 9* 16* 20*  GLUCOSE 128* 194* 180*  BUN CREATININE 1.12 1.29* 1.04  CALCIUM 9.1 9.4 8.9  PHOS  --  3.7  --    Liver Function Tests:  Recent Labs Lab 07/02/15 2205 07/03/15 0550 07/03/15 1936 07/04/15 0451  AST 35 26  --  27  ALT 42 34  --  34  ALKPHOS 72 55  --  58  BILITOT 0.8 0.7  --  0.8  PROT 7.1 6.7  --  6.6  ALBUMIN 3.9 3.5 3.5 3.6    Recent Labs Lab 07/02/15 2205  LIPASE 43   No results for input(s): AMMONIA in the last 168 hours. CBC:  Recent Labs Lab 07/02/15 2205 07/03/15 1936 07/04/15 0451  WBC  8.7 8.1 7.4  HGB 17.6* 15.8 16.1  HCT 52.3* 48.7 47.8  MCV 93.1 93.8 91.9  PLT 247 192 172   Cardiac Enzymes:  Recent Labs Lab 07/03/15 0550  CKTOTAL 142   CBG:  Recent Labs Lab 07/04/15 1204 07/04/15 1552 07/04/15 1950 07/04/15 2334 07/05/15 0330  GLUCAP 157* 202* 253* 118* 114*    Iron Studies: No results for input(s): IRON, TIBC, TRANSFERRIN, FERRITIN in the last 72 hours. Studies/Results: Dg Chest Port 1 View  07/03/2015  CLINICAL DATA:  Central line placement EXAM: PORTABLE CHEST 1 VIEW COMPARISON:  07/03/2015 at 17:47 FINDINGS: There is a new left jugular central line with tip in the expected location of the SVC just below the azygos vein junction. There is no pneumothorax. The lungs are clear. No other significant interval change. IMPRESSION: New left jugular central line.  No pneumothorax. Electronically Signed   By: Ellery Plunk M.D.   On: 07/03/2015 19:02   Dg Chest Port 1 View  07/03/2015  CLINICAL DATA:  Attempted central line placement EXAM: PORTABLE CHEST 1 VIEW COMPARISON:  07/03/2015 FINDINGS: No IJ catheter  visualize. No pneumothorax after attempted catheter placement. The heart size and mediastinal contours are within normal limits. Both lungs are clear. The visualized skeletal structures are unremarkable. IMPRESSION: No pneumothorax identified after attempted right IJ catheter placement. Electronically Signed   By: Signa Kellaylor  Stroud M.D.   On: 07/03/2015 17:58   Ct Angio Abd/pel W/ And/or W/o  07/03/2015  CLINICAL DATA:  RLQ pain with some nausea, concern for ischemic bowel, patient is diabetic Patient has drank ethylene glycol about 2 months ago, EXAM: CTA ABDOMEN AND PELVIS WITHOUT CONTRAST TECHNIQUE: Multidetector CT imaging of the abdomen and pelvis was performed using the standard protocol during bolus administration of intravenous contrast. Multiplanar reconstructed images and MIPs were obtained and reviewed to evaluate the vascular anatomy. CONTRAST:  100  ml isovue 370 COMPARISON:  None. FINDINGS: Mild diffuse hepatic steatosis. No focal hepatic abnormalities. Gallbladder normal. Pancreas and spleen are normal. Adrenal glands are normal. Kidneys are normal. No hydronephrosis. Bladder is normal. Reproductive organs are normal. No significant adenopathy. No ascites. Small fat containing right inguinal hernia. Minimal atherosclerotic calcification abdominal aorta. No aortic dilatation or dissection. Normal opacification of the abdominal aorta as well as of its major branches. Appendix is not identified. There is no evidence of appendicitis. Large bowel, small bowel, and stomach appear normal. Three vessel coronary artery calcification. Visualized portions of the lung bases are clear. No acute musculoskeletal findings. Review of the MIP images confirms the above findings. IMPRESSION: No acute abnormalities to account for the patient's symptoms. Electronically Signed   By: Esperanza Heiraymond  Rubner M.D.   On: 07/03/2015 10:20   Medications: Infusions: .  sodium bicarbonate 150 mEq in sterile water 1000 mL infusion 150 mEq (07/05/15 0646)    Scheduled Medications: . heparin  5,000 Units Subcutaneous Q8H  . insulin aspart  0-20 Units Subcutaneous Q4H  . sodium chloride flush  3 mL Intravenous Q12H    have reviewed scheduled and prn medications.  Physical Exam: General: Alert, cooperative, no distress Heart: RRR, no murmurs Lungs:CTAB Abdomen:Obese, +BS, S, NT, ND Extremities: Trace bilateral pretibial edema Dialysis Access: trialysis HD catheter in left IJ  Caleb M. Jimmey RalphParker, MD [facility] Resident PGY-2 07/05/2015 8:46 AM

## 2015-07-05 NOTE — Progress Notes (Signed)
Inpatient Diabetes Program Recommendations  AACE/ADA: New Consensus Statement on Inpatient Glycemic Control (2015)  Target Ranges:  Prepandial:   less than 140 mg/dL      Peak postprandial:   less than 180 mg/dL (1-2 hours)      Critically ill patients:  140 - 180 mg/dL  Results for Karolee StampsCOE, Rashawn (MRN [id number]) as of 07/05/2015 07:48  Ref. Range 07/04/2015 03:50 07/04/2015 08:09 07/04/2015 12:04 07/04/2015 15:52 07/04/2015 19:50 07/04/2015 23:34 07/05/2015 03:30  Glucose-Capillary Latest Ref Range: 65-99 mg/dL 409161 (H) 811169 (H) 914157 (H) 202 (H) 253 (H) 118 (H) 114 (H)   Review of Glycemic Control  Diabetes history: DM2 Outpatient Diabetes medications: Amaryl 2 mg daily Current orders for Inpatient glycemic control: Novolog 0-20 units Q4H  Inpatient Diabetes Program Recommendations: Insulin - Basal: Patient has received a total of Novolog 30 units for correction over the past 24 hours. Please consider ordering low dose basal insulin. Recommend starting with Levemir 10 units daily. Correction (SSI): Patient is eating 100% of meals. Please consider changing frequency of CBGs and Novolog correction to ACHS.  Thanks, Orlando PennerMarie Jermia Rigsby, RN, MSN, CDE Diabetes Coordinator Inpatient Diabetes Program [phone] (Team Pager from 8am to 5pm) [phone] ([facility] office) [phone] [facility] office) [phone] [facility] office)

## 2015-07-05 NOTE — Progress Notes (Addendum)
PROGRESS NOTE    Derrick StampsDuane Austin  QQV:[id number] DOB: [birth year] DOA: 07/03/2015 PCP: Thalia BloodgoodPRINCE, WILLIAM D III, MD     Brief Narrative: 55 year old male with history of recurrent antifreeze poisoning, multiple hospitalizations in 2015 and 2016, [facility], comes to the ED with a chief complaint of right flank pain. He was admitted for for mixed AG metabolic acidosis with respiratory alkalosis, possibly anti freeze ingestion and underwent HD treatment. He also made some comments suggestive of intentional self harm and psychiatry consulted for further evaluation.   Assessment & Plan:   Principal Problem:   Continuous RLQ abdominal pain Active Problems:   Metabolic acidosis, increased anion gap   Diabetes mellitus type II, uncontrolled (HCC)   Morbid obesity with BMI of 40.0-44.9, adult (HCC)   HTN (hypertension)   Bilateral inguinal hernia   Constipation   Depression   RIGHT FLANK PAIN: Resolved.  Unclear etiology.  Resume diet.   Metabolic acidosis suspected ethylene glycol overdose: - HD treatment , resolved. Renal consulted and recommendations given.  ag is closed, bicarb is 28 and creatinine improved.    Suicidal ideation? ont he night of admission, he made some comments suggestive of intention self harm. He currently denies any suicidal ideation. Called psychiatry consult for further evaluation.    Hypertension:  sub optimally Controlled. Resume lisinopril.    Constipation: Stool softeners.    Morbid obesity.   Diabetes Mellitus: CBG (last 3)   Recent Labs  07/05/15 0330 07/05/15 0750 07/05/15 1317  GLUCAP 114* 144* 142*   hgba1c is 9.4, he was on amaryl at home,  Stop it and start him on levemir and SSI.  Discussed with the patient.     DVT prophylaxis: sq heparin Code Status: full code Family Communication: none at bedside, and discussed the plan of care with him Disposition Plan: pending psychiatry and PT  evaluation   Consultants:   Renal  pccm for HD catheter placement  Psychiatry.   Procedures: HD treatment.   Antimicrobials: none   Subjective: Wants to know when he can go home.   Objective: Filed Vitals:   07/05/15 0500 07/05/15 0600 07/05/15 0743 07/05/15 1144  BP:  148/96 160/82 158/86  Pulse:  61 68 78  Temp:   97.9 F (36.6 C) 98 F (36.7 C)  TempSrc:   Oral Oral  Resp:  17 15 20   Height:      Weight: 156.264 kg (344 lb 8 oz)     SpO2:  94% 95% 97%    Intake/Output Summary (Last 24 hours) at 07/05/15 1425 Last data filed at 07/05/15 1152  Gross per 24 hour  Intake   2940 ml  Output    700 ml  Net   2240 ml   Filed Weights   07/02/15 2157 07/04/15 0432 07/05/15 0500  Weight: 156.689 kg (345 lb 7 oz) 149.9 kg (330 lb 7.5 oz) 156.264 kg (344 lb 8 oz)    Examination:  General exam: Appears calm and comfortable  Respiratory system: diminished at bases due to body habitus. Respiratory effort normal. Cardiovascular system: S1 & S2 heard, RRR. No JVD, murmurs, rubs, gallops or clicks. No pedal edema. Gastrointestinal system: Abdomen is nondistended, soft and non tender, obese. No organomegaly or masses felt. Normal bowel sounds heard. Central nervous system: Alert and oriented. No focal neurological deficits.  Skin: No rashes, lesions or ulcers     Data Reviewed: I have personally reviewed following labs and imaging  studies  CBC:  Recent Labs Lab 07/02/15 2205 07/03/15 1936 07/04/15 0451  WBC 8.7 8.1 7.4  HGB 17.6* 15.8 16.1  HCT 52.3* 48.7 47.8  MCV 93.1 93.8 91.9  PLT 247 192 172   Basic Metabolic Panel:  Recent Labs Lab 07/03/15 0305 07/03/15 1123 07/03/15 1936 07/04/15 0451 07/05/15 0831  NA 136 137 139 138 138  K 4.8 4.6 4.4 4.2 3.2*  CL 106 109 109 107 98*  CO2 11* 9* 16* 20* 28  GLUCOSE 149* 128* 194* 180* 152*  BUN CREATININE 1.08 1.12 1.29* 1.04 1.01  CALCIUM 9.4 9.1 9.4 8.9 8.5*  PHOS  --   --  3.7   --  2.6   GFR: Estimated Creatinine Clearance: 132.4 mL/min (by C-G formula based on Cr of 1.01). Liver Function Tests:  Recent Labs Lab 07/02/15 2205 07/03/15 0550 07/03/15 1936 07/04/15 0451 07/05/15 0831  AST 35 26  --  27  --   ALT 42 34  --  34  --   ALKPHOS 72 55  --  58  --   BILITOT 0.8 0.7  --  0.8  --   PROT 7.1 6.7  --  6.6  --   ALBUMIN 3.9 3.5 3.5 3.6 3.1*    Recent Labs Lab 07/02/15 2205  LIPASE 43   No results for input(s): AMMONIA in the last 168 hours. Coagulation Profile: No results for input(s): INR, PROTIME in the last 168 hours. Cardiac Enzymes:  Recent Labs Lab 07/03/15 0550  CKTOTAL 142   BNP (last 3 results) No results for input(s): PROBNP in the last 8760 hours. HbA1C:  Recent Labs  07/03/15 1019  HGBA1C 9.4*   CBG:  Recent Labs Lab 07/04/15 1950 07/04/15 2334 07/05/15 0330 07/05/15 0750 07/05/15 1317  GLUCAP 253* 118* 114* 144* 142*   Lipid Profile: No results for input(s): CHOL, HDL, LDLCALC, TRIG, CHOLHDL, LDLDIRECT in the last 72 hours. Thyroid Function Tests: No results for input(s): TSH, T4TOTAL, FREET4, T3FREE, THYROIDAB in the last 72 hours. Anemia Panel: No results for input(s): VITAMINB12, FOLATE, FERRITIN, TIBC, IRON, RETICCTPCT in the last 72 hours. Urine analysis:    Component Value Date/Time   COLORURINE YELLOW 07/03/2015 0835   APPEARANCEUR CLEAR 07/03/2015 0835   LABSPEC 1.010 07/03/2015 0835   PHURINE 5.0 07/03/2015 0835   GLUCOSEU NEGATIVE 07/03/2015 0835   HGBUR NEGATIVE 07/03/2015 0835   BILIRUBINUR NEGATIVE 07/03/2015 0835   KETONESUR NEGATIVE 07/03/2015 0835   PROTEINUR NEGATIVE 07/03/2015 0835   NITRITE NEGATIVE 07/03/2015 0835   LEUKOCYTESUR NEGATIVE 07/03/2015 0835   Sepsis Labs: (procalcitonin:4,lacticidven:4)  ) Recent Results (from the past 240 hour(s))  Urine culture     Status: Abnormal   Collection Time: 07/03/15  8:35 AM  Result Value Ref Range Status   Specimen  Description URINE, RANDOM  Final   Special Requests NONE  Final   Culture MULTIPLE SPECIES PRESENT, SUGGEST RECOLLECTION (A)  Final   Report Status 07/05/2015 FINAL  Final  MRSA PCR Screening     Status: None   Collection Time: 07/04/15  1:59 AM  Result Value Ref Range Status   MRSA by PCR NEGATIVE NEGATIVE Final    Comment:        The GeneXpert MRSA Assay (FDA approved for NASAL specimens only), is one component of a comprehensive MRSA colonization surveillance program. It is not intended to diagnose MRSA infection nor to guide or monitor treatment for MRSA infections.  Radiology Studies: Dg Chest Port 1 View  07/03/2015  CLINICAL DATA:  Central line placement EXAM: PORTABLE CHEST 1 VIEW COMPARISON:  07/03/2015 at 17:47 FINDINGS: There is a new left jugular central line with tip in the expected location of the SVC just below the azygos vein junction. There is no pneumothorax. The lungs are clear. No other significant interval change. IMPRESSION: New left jugular central line.  No pneumothorax. Electronically Signed   By: Ellery Plunk M.D.   On: 07/03/2015 19:02   Dg Chest Port 1 View  07/03/2015  CLINICAL DATA:  Attempted central line placement EXAM: PORTABLE CHEST 1 VIEW COMPARISON:  07/03/2015 FINDINGS: No IJ catheter visualize. No pneumothorax after attempted catheter placement. The heart size and mediastinal contours are within normal limits. Both lungs are clear. The visualized skeletal structures are unremarkable. IMPRESSION: No pneumothorax identified after attempted right IJ catheter placement. Electronically Signed   By: Signa Kell M.D.   On: 07/03/2015 17:58        Scheduled Meds: . heparin  5,000 Units Subcutaneous Q8H  . insulin aspart  0-15 Units Subcutaneous TID WC  . insulin aspart  0-5 Units Subcutaneous QHS  . insulin detemir  8 Units Subcutaneous Daily  . potassium chloride  40 mEq Oral BID  . sodium chloride flush  3 mL Intravenous Q12H     Continuous Infusions:     LOS: 2 days    Time spent: 30 min    Emelina Hinch, MD [facility] Pager [phone]   If 7PM-7AM, please contact night-coverage [email or web address] Password [email or web address] 07/05/2015, 2:25 PM

## 2015-07-06 DIAGNOSIS — F331 Major depressive disorder, recurrent, moderate: Secondary | ICD-10-CM

## 2015-07-06 DIAGNOSIS — F329 Major depressive disorder, single episode, unspecified: Secondary | ICD-10-CM

## 2015-07-06 LAB — BASIC METABOLIC PANEL
ANION GAP: 11 (ref 5–15)
BUN: 11 mg/dL (ref 6–20)
CHLORIDE: 104 mmol/L (ref 101–111)
CO2: 25 mmol/L (ref 22–32)
Calcium: 8.7 mg/dL — ABNORMAL LOW (ref 8.9–10.3)
Creatinine, Ser: 0.97 mg/dL (ref 0.61–1.24)
GFR calc Af Amer: >60 mL/min (ref >60)
Glucose, Bld: 112 mg/dL — ABNORMAL HIGH (ref 65–99)
POTASSIUM: 4 mmol/L (ref 3.5–5.1)
SODIUM: 140 mmol/L (ref 135–145)

## 2015-07-06 LAB — GLUCOSE, CAPILLARY
GLUCOSE-CAPILLARY: 147 mg/dL — AB (ref 65–99)
GLUCOSE-CAPILLARY: 138 mg/dL — AB (ref 65–99)
GLUCOSE-CAPILLARY: 102 mg/dL — AB (ref 65–99)

## 2015-07-06 LAB — LACTIC ACID, PLASMA: LACTIC ACID, VENOUS: 0.9 mmol/L (ref 0.5–2.0)

## 2015-07-06 MED ORDER — INSULIN DETEMIR 100 UNIT/ML ~~LOC~~ SOLN: 8.0000 [IU] | Freq: Every day | SUBCUTANEOUS | Status: AC

## 2015-07-06 MED ORDER — INSULIN ASPART 100 UNIT/ML ~~LOC~~ SOLN: 0.0000 [IU] | Freq: Three times a day (TID) | SUBCUTANEOUS | Status: AC

## 2015-07-06 NOTE — Consult Note (Signed)
[facility] Face-to-Face Psychiatry Consult   Reason for Consult:  Depression and history of suicidal ideation and attempts Referring Physician:  Dr. Karleen Hampshire Patient Identification: Derrick Austin MRN:  [id number] Principal Diagnosis: Depression Diagnosis:   Patient Active Problem List   Diagnosis Date Noted  . Metabolic acidosis, increased anion gap [E87.2] 07/03/2015  . Continuous RLQ abdominal pain [R10.31] 07/03/2015  . Diabetes mellitus type II, uncontrolled ([place]) [E11.65] 07/03/2015  . Morbid obesity with BMI of 40.0-44.9, adult ([place]) [E66.01, Z68.41] 07/03/2015  . HTN (hypertension) [I10] 07/03/2015  . Bilateral inguinal hernia [K40.20] 07/03/2015  . Constipation [K59.00] 07/03/2015  . Depression [F32.9] 07/03/2015  . 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 Encounter for central line care [Z45.2]   . Metabolic acidosis [B26.2]     Total Time spent with patient: 1 hour  Subjective:   Derrick Austin is a 55 y.o. male patient admitted with abdominal pain*.  HPI:  Derrick Austin is a 55 years old male admitted to [facility] for right abdominal pain. Patient reported he came to [facility] from [place] and find himself having a right abdominal pain and then drove to the hospital to be admitted. Patient also endorses history of major depressive disorder and multiple antifreeze intentional overdose and required to be hospitalized several times in [place] including [place], [place], [facility] in [place]. Patient reportedly lives with his father and has a financial difficulties. Patient denies current symptoms of depression and related his depression as 2 out of 10 denies any anxiety, no evidence of auditory or visual hallucinations, delusions or paranoia. Patient denies active suicidal and homicidal ideation, intention or plans. Patient has a plans of following up with the psychiatrist at [facility] at [place], [place]. Patient contract for safety and willing to  follow up with outpatient medication management.  Past Psychiatric History: Major depressive disorder and history of antifreeze intentional overdose and multiple psychiatric hospitalization in [place].  Risk to Self: Is patient at risk for suicide?: No Risk to Others:   Prior Inpatient Therapy:   Prior Outpatient Therapy:    Past Medical History:  Past Medical History  Diagnosis Date  . Diabetes mellitus without complication ([place])   . Hypertension   . Hyperlipidemia   . Previous known suicide attempt   . H/O ethylene glycol poisoning   . Depression, major, in partial remission [facility])     Past Surgical History  Procedure Laterality Date  . Appendectomy    . Eye surgery    . Ankle surgery    . Toe amputation    . Knee surgery    . Hammer toe surgery      both great toes   Family History: History reviewed. No pertinent family history. Family Psychiatric  History: Denied  Social History:  History  Alcohol Use No     History  Drug Use No    Social History   Social History  . Marital Status: Single    Spouse Name: N/A  . Number of Children: N/A  . Years of Education: N/A   Social History Main Topics  . Smoking status: Never Smoker   . Smokeless tobacco: Never Used  . Alcohol Use: No  . Drug Use: No  . Sexual Activity: Not Asked   Other Topics Concern  . None   Social History Narrative   Additional Social History:    Allergies:   Allergies  Allergen Reactions  . Bactrim [Sulfamethoxazole-Trimethoprim] Hives  . Penicillins Hives    Has patient  had a PCN reaction causing immediate rash, facial/tongue/throat swelling, SOB or lightheadedness with hypotension: no Has patient had a PCN reaction causing severe rash involving mucus membranes or skin necrosis: unknown Has patient had a PCN reaction that required hospitalization : no Has patient had a PCN reaction occurring within the last 10 years: no If all of the above answers are "NO", then may proceed with  Cephalosporin use.   Marland Kitchen Keflex [Cephalexin] Rash    Labs:  Results for orders placed or performed during the hospital encounter of 07/03/15 (from the past 48 hour(s))  Glucose, capillary     Status: Abnormal   Collection Time: 07/04/15  3:52 PM  Result Value Ref Range   Glucose-Capillary 202 (H) 65 - 99 mg/dL  Glucose, capillary     Status: Abnormal   Collection Time: 07/04/15  7:50 PM  Result Value Ref Range   Glucose-Capillary 253 (H) 65 - 99 mg/dL  Glucose, capillary     Status: Abnormal   Collection Time: 07/04/15 11:34 PM  Result Value Ref Range   Glucose-Capillary 118 (H) 65 - 99 mg/dL  Glucose, capillary     Status: Abnormal   Collection Time: 07/05/15  3:30 AM  Result Value Ref Range   Glucose-Capillary 114 (H) 65 - 99 mg/dL  Glucose, capillary     Status: Abnormal   Collection Time: 07/05/15  7:50 AM  Result Value Ref Range   Glucose-Capillary 144 (H) 65 - 99 mg/dL  Renal function panel     Status: Abnormal   Collection Time: 07/05/15  8:31 AM  Result Value Ref Range   Sodium 138 135 - 145 mmol/L   Potassium 3.2 (L) 3.5 - 5.1 mmol/L   Chloride 98 (L) 101 - 111 mmol/L   CO2 28 22 - 32 mmol/L   Glucose, Bld 152 (H) 65 - 99 mg/dL   BUN 13 6 - 20 mg/dL   Creatinine, Ser 1.01 0.61 - 1.24 mg/dL   Calcium 8.5 (L) 8.9 - 10.3 mg/dL   Phosphorus 2.6 2.5 - 4.6 mg/dL   Albumin 3.1 (L) 3.5 - 5.0 g/dL   GFR calc non Af Amer >60 >60 mL/min   GFR calc Af Amer >60 >60 mL/min    Comment: (NOTE) The eGFR has been calculated using the CKD EPI equation. This calculation has not been validated in all clinical situations. eGFR's persistently <60 mL/min signify possible Chronic Kidney Disease.    Anion gap 12 5 - 15  Lactic acid, plasma     Status: Abnormal   Collection Time: 07/05/15 11:08 AM  Result Value Ref Range   Lactic Acid, Venous 2.4 (HH) 0.5 - 2.0 mmol/L    Comment: CRITICAL RESULT CALLED TO, READ BACK BY AND VERIFIED WITH: CAROLYN WHITE,RN AT 9381 07/05/15 BY ZBEECH.    Glucose, capillary     Status: Abnormal   Collection Time: 07/05/15  1:17 PM  Result Value Ref Range   Glucose-Capillary 142 (H) 65 - 99 mg/dL  Glucose, capillary     Status: Abnormal   Collection Time: 07/05/15  4:44 PM  Result Value Ref Range   Glucose-Capillary 178 (H) 65 - 99 mg/dL  Glucose, capillary     Status: Abnormal   Collection Time: 07/05/15  8:10 PM  Result Value Ref Range   Glucose-Capillary 233 (H) 65 - 99 mg/dL  Lactic acid, plasma     Status: None   Collection Time: 07/06/15  2:47 AM  Result Value Ref Range   Lactic Acid,  Venous 0.9 0.5 - 2.0 mmol/L  Basic metabolic panel     Status: Abnormal   Collection Time: 07/06/15  2:47 AM  Result Value Ref Range   Sodium 140 135 - 145 mmol/L   Potassium 4.0 3.5 - 5.1 mmol/L    Comment: DELTA CHECK NOTED   Chloride 104 101 - 111 mmol/L   CO2 25 22 - 32 mmol/L   Glucose, Bld 112 (H) 65 - 99 mg/dL   BUN 11 6 - 20 mg/dL   Creatinine, Ser 0.97 0.61 - 1.24 mg/dL   Calcium 8.7 (L) 8.9 - 10.3 mg/dL   GFR calc non Af Amer >60 >60 mL/min   GFR calc Af Amer >60 >60 mL/min    Comment: (NOTE) The eGFR has been calculated using the CKD EPI equation. This calculation has not been validated in all clinical situations. eGFR's persistently <60 mL/min signify possible Chronic Kidney Disease.    Anion gap 11 5 - 15  Glucose, capillary     Status: Abnormal   Collection Time: 07/06/15  8:49 AM  Result Value Ref Range   Glucose-Capillary 147 (H) 65 - 99 mg/dL  Glucose, capillary     Status: Abnormal   Collection Time: 07/06/15 12:47 PM  Result Value Ref Range   Glucose-Capillary 138 (H) 65 - 99 mg/dL    Current Facility-Administered Medications  Medication Dose Route Frequency Provider Last Rate Last Dose  . acetaminophen (TYLENOL) tablet 650 mg  650 mg Oral Q6H PRN Samella Parr, NP       Or  . acetaminophen (TYLENOL) suppository 650 mg  650 mg Rectal Q6H PRN Samella Parr, NP      . heparin injection 5,000 Units  5,000  Units Subcutaneous Q8H Samella Parr, NP   5,000 Units at 07/06/15 1324  . HYDROmorphone (DILAUDID) injection 1 mg  1 mg Intravenous Q2H PRN Samella Parr, NP   1 mg at 07/03/15 1658  . insulin aspart (novoLOG) injection 0-15 Units  0-15 Units Subcutaneous TID WC Hosie Poisson, MD   2 Units at 07/06/15 1320  . insulin aspart (novoLOG) injection 0-5 Units  0-5 Units Subcutaneous QHS Hosie Poisson, MD   2 Units at 07/05/15 2137  . insulin detemir (LEVEMIR) injection 8 Units  8 Units Subcutaneous Daily Hosie Poisson, MD   8 Units at 07/06/15 0926  . lisinopril (PRINIVIL,ZESTRIL) tablet 20 mg  20 mg Oral Daily Hosie Poisson, MD   20 mg at 07/06/15 0925  . ondansetron (ZOFRAN) injection 4 mg  4 mg Intravenous Q6H PRN Samella Parr, NP      . 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 Patient denies nausea, vomiting, abdomen pain, shortness of breath and chest pain. No Fever-chills, No Headache, No changes with Vision or hearing, reports vertigo No problems swallowing food or Liquids, No Chest pain, Cough or Shortness of Breath, No Abdominal pain, No Nausea or Vommitting, Bowel movements are regular, No Blood in stool or Urine, No dysuria, No new skin rashes or bruises, No new joints pains-aches,  No new weakness, tingling, numbness in any extremity, No recent weight gain or loss, No polyuria, polydypsia or polyphagia,  A full 10 point  Review of Systems was done, except as stated above, all other Review of Systems were negative.  Blood pressure 135/82, pulse 72, temperature 98.7 F (37.1 C), temperature source Oral, resp. rate 20, height _0  (1.93 m), weight 156.899 kg (345 lb 14.4 oz), SpO2 92 %.Body mass  index is 42.12 kg/(m^2).  General Appearance: Casual  Eye Contact::  Good  Speech:  Clear and Coherent  Volume:  Normal  Mood:  Depressed  Affect:  Appropriate and Congruent  Thought Process:  Coherent and Goal Directed  Orientation:  Full (Time, Place, and Person)  Thought Content:  WDL  Suicidal Thoughts:  No  Homicidal Thoughts:  No  Memory:  Immediate;   Fair Recent;   Fair Remote;   Fair  Judgement:  Fair  Insight:  Good  Psychomotor Activity:  Normal  Concentration:  Good  Recall:  Good  Fund of Knowledge:Good  Language: Good  Akathisia:  Negative  Handed:  Right  AIMS (if indicated):     Assets:  Communication Skills Desire for Improvement Housing Leisure Time Resilience Transportation  ADL's:  Intact  Cognition: WNL  Sleep:      Treatment Plan Summary: Patient presented with the right abdomen pain, history of major depressive disorder and antifreeze poisoning. Patient denies current symptoms of depression, anxiety, psychosis and denies active suicidal/homicidal ideation, intention or plans.    Patient has no safety concerns, and contract for safety  Patient does not meet criteria for involuntary commitment as he has no safety concerns and contract for safety  Recommended no psychiatric medication changes during this visit  Patient will be referred to the outpatient medication management for depression at [facility] in [place].  Appreciate psychiatric consultation and we sign off as of today Please contact [phone] or [phone] if needs further assistance   Disposition: Case discussed with Dr. Karleen Hampshire and may discharged home and follow up with outpatient psychiatric services. No evidence of imminent risk to self or others at present.   Supportive therapy provided about ongoing stressors.  Durward Parcel., MD 07/06/2015 2:49 PM

## 2015-07-06 NOTE — Progress Notes (Signed)
Attempted to give report to [facility] nurse.  Nurse is currently busy and she will call me back per [employer] operational officersecretary.

## 2015-07-06 NOTE — Plan of Care (Signed)
Problem: Safety: Goal: Ability to remain free from injury will improve Outcome: Progressing Patient still requires a suicide sitter due to history.

## 2015-07-07 NOTE — Progress Notes (Signed)
Patient is being discharged to home via private vehicle.  Prescription given to patient.  Patient is alert and oriented and verbalized understanding when discharge instructions were given.  Walked to vehicle by staff.

## 2015-07-07 NOTE — Discharge Summary (Signed)
Physician Discharge Summary  Derrick Austin ZOX:[id number] DOB: 21-May-1960 DOA: 07/03/2015  PCP: Thalia Bloodgood, MD  Admit date: 07/03/2015 Discharge date: 07/07/2015  Time spent: 30 minutes  Recommendations for Outpatient Follow-up:  1. Follow up with PCP in one week.  2. Follow up with psychiatry as outpatient.    Discharge Diagnoses:  Principal Problem:   Depression Active Problems:   Metabolic acidosis, increased anion gap   Continuous RLQ abdominal pain   Diabetes mellitus type II, uncontrolled (HCC)   Morbid obesity with BMI of 40.0-44.9, adult (HCC)   HTN (hypertension)   Bilateral inguinal hernia   Constipation   Discharge Condition: improved  Diet recommendation: carb modified   Filed Weights   07/04/15 0432 07/05/15 0500 07/06/15 0420  Weight: 149.9 kg (330 lb 7.5 oz) 156.264 kg (344 lb 8 oz) 156.899 kg (345 lb 14.4 oz)    History of present illness:  55 year old male with history of recurrent antifreeze poisoning, multiple hospitalizations in 2015 and 2016, including one just a week ago at [facility] with same, comes to the ED with a chief complaint of right flank pain. He was admitted for for mixed AG metabolic acidosis with respiratory alkalosis, possibly anti freeze ingestion and underwent HD treatment. He also made some comments suggestive of intentional self harm and psychiatry consulted for further evaluation. On further questioning he reports he didn't say any of the words and want to go home.   Hospital Course:  Metabolic acidosis suspected ethylene glycol overdose: - HD treatment , resolved. Renal consulted and recommendations given.  ag is closed, bicarb is 28 and creatinine improved.  - HD catheter removed and he was discharged home.     Suicidal ideation? ont he night of admission, he made some comments suggestive of intention self harm. He currently denies any suicidal ideation. Called psychiatry consult for further evaluation recommended  outpatient follow up.     Hypertension: sub optimally Controlled. Resume lisinopril.    Constipation: Stool softeners.    Morbid obesity.   Diabetes Mellitus: CBG (last 3)   Recent Labs (last 2 labs)      Recent Labs  07/05/15 0330 07/05/15 0750 07/05/15 1317  GLUCAP 114* 144* 142*     hgba1c is 9.4, he was on amaryl at home,  Stop it and started him on levemir and SSI. requested the RN to teach pt to administer insulin.  Discussed with the patient.           Procedures:  HD session   Consultations: Psychiatry Nephrology.  Discharge Exam: Filed Vitals:   07/06/15 1133 07/06/15 1600  BP: 135/82 145/106  Pulse: 72 70  Temp: 98.7 F (37.1 C) 98 F (36.7 C)  Resp: 20 25    General: alert afebrile comfortable.  Cardiovascular: s1s2 Respiratory: ctab  Discharge Instructions   Discharge Instructions    Diet - low sodium heart healthy    Complete by:  As directed      Discharge instructions    Complete by:  As directed   Follow up with PCP in one week.          Discharge Medication List as of 07/06/2015  7:18 PM    START taking these medications   Details  insulin aspart (NOVOLOG) 100 UNIT/ML injection Inject 0-15 Units into the skin 3 (three) times daily with meals., Starting 07/06/2015, Until Discontinued, Print    insulin detemir (LEVEMIR) 100 UNIT/ML injection Inject 0.08 mLs (8 Units total) into the skin daily., Starting  07/06/2015, Until Discontinued, Print      CONTINUE these medications which have NOT CHANGED   Details  lisinopril-hydrochlorothiazide (PRINZIDE,ZESTORETIC) 20-25 MG tablet Take 1 tablet by mouth daily., Starting 08/26/2013, Until Discontinued, Historical Med    loratadine (CLARITIN) 10 MG tablet Take 10 mg by mouth daily., Until Discontinued, Historical Med    Multiple Vitamin (MULTIVITAMIN WITH MINERALS) TABS tablet Take 1 tablet by mouth daily., Until Discontinued, Historical Med    sertraline (ZOLOFT) 50 MG  tablet Take 150 mg by mouth daily., Until Discontinued, Historical Med    simvastatin (ZOCOR) 20 MG tablet Take 20 mg by mouth daily., Starting 08/31/2014, Until Discontinued, Historical Med      STOP taking these medications     glimepiride (AMARYL) 2 MG tablet        Allergies  Allergen Reactions  . Bactrim [Sulfamethoxazole-Trimethoprim] Hives  . Penicillins Hives    Has patient had a PCN reaction causing immediate rash, facial/tongue/throat swelling, SOB or lightheadedness with hypotension: no Has patient had a PCN reaction causing severe rash involving mucus membranes or skin necrosis: unknown Has patient had a PCN reaction that required hospitalization : no Has patient had a PCN reaction occurring within the last 10 years: no If all of the above answers are "NO", then may proceed with Cephalosporin use.   Marland Kitchen. Keflex [Cephalexin] Rash   Follow-up Information    Follow up with Albertha GheePRINCE, WILLIAM D III, MD. Schedule an appointment as soon as possible for a visit in 1 week.   Specialty:  Internal Medicine   Contact information:   [address] [place] [id number] [phone]        The results of significant diagnostics from this hospitalization (including imaging, microbiology, ancillary and laboratory) are listed below for reference.    Significant Diagnostic Studies: Dg Chest Port 1 View  07/03/2015  CLINICAL DATA:  Central line placement EXAM: PORTABLE CHEST 1 VIEW COMPARISON:  07/03/2015 at 17:47 FINDINGS: There is a new left jugular central line with tip in the expected location of the SVC just below the azygos vein junction. There is no pneumothorax. The lungs are clear. No other significant interval change. IMPRESSION: New left jugular central line.  No pneumothorax. Electronically Signed   By: Ellery Plunkaniel R Mitchell M.D.   On: 07/03/2015 19:02   Dg Chest Port 1 View  07/03/2015  CLINICAL DATA:  Attempted central line placement EXAM: PORTABLE CHEST 1 VIEW COMPARISON:   07/03/2015 FINDINGS: No IJ catheter visualize. No pneumothorax after attempted catheter placement. The heart size and mediastinal contours are within normal limits. Both lungs are clear. The visualized skeletal structures are unremarkable. IMPRESSION: No pneumothorax identified after attempted right IJ catheter placement. Electronically Signed   By: Signa Kellaylor  Stroud M.D.   On: 07/03/2015 17:58   Dg Chest Portable 1 View  07/03/2015  CLINICAL DATA:  Cough and shortness of breath tonight EXAM: PORTABLE CHEST 1 VIEW COMPARISON:  05/30/2015 FINDINGS: No cardiomegaly for technique. Stable aortic tortuosity. There is no edema, consolidation, effusion, or pneumothorax. IMPRESSION: Negative portable chest. Electronically Signed   By: Marnee SpringJonathon  Watts M.D.   On: 07/03/2015 06:00   Ct Renal Stone Study  07/03/2015  CLINICAL DATA:  RIGHT flank pain and nausea beginning last night. History of appendectomy, hypertension, hyperlipidemia and diabetes. EXAM: CT ABDOMEN AND PELVIS WITHOUT CONTRAST TECHNIQUE: Multidetector CT imaging of the abdomen and pelvis was performed following the standard protocol without IV contrast. COMPARISON:  None. FINDINGS: LUNG BASES: Included view of the  lung bases are clear. Included heart size is normal. Moderate coronary artery calcifications. No pericardial effusion. KIDNEYS/BLADDER: Kidneys are orthotopic, demonstrating normal size and morphology. No nephrolithiasis, hydronephrosis; limited assessment for renal masses on this nonenhanced examination. The unopacified ureters are normal in course and caliber. Urinary bladder is partially distended and unremarkable. SOLID ORGANS: The liver, spleen, gallbladder, pancreas and adrenal glands are unremarkable for this non-contrast examination. GASTROINTESTINAL TRACT: The stomach, small and large bowel are normal in course and caliber without inflammatory changes, the sensitivity may be decreased by lack of enteric contrast. Mild to moderate amount of  retained large bowel stool. A few scattered colonic diverticula Nonvisualized appendix compatible with provided history. PERITONEUM/RETROPERITONEUM: Aortoiliac vessels are normal in course and caliber, trace calcific atherosclerosis. No lymphadenopathy by CT size criteria. Prostate size is normal. No intraperitoneal free fluid nor free air. SOFT TISSUES/ OSSEOUS STRUCTURES: Nonsuspicious. Large RIGHT and small LEFT fat containing inguinal hernias. Small fat containing umbilical hernia. Multilevel moderate to severe lower lumbar facet arthropathy. Moderate bilateral sacroiliac osteoarthrosis, less likely sacroiliitis. IMPRESSION: No urolithiasis, obstructive uropathy nor acute intra-abdominal/pelvic process. Mild to moderate amount of retained large bowel stool without bowel obstruction. Large RIGHT and small LEFT fat containing inguinal hernias. Electronically Signed   By: Awilda Metro M.D.   On: 07/03/2015 05:06   Ct Angio Abd/pel W/ And/or W/o  07/03/2015  CLINICAL DATA:  RLQ pain with some nausea, concern for ischemic bowel, patient is diabetic Patient has drank ethylene glycol about 2 months ago, EXAM: CTA ABDOMEN AND PELVIS WITHOUT CONTRAST TECHNIQUE: Multidetector CT imaging of the abdomen and pelvis was performed using the standard protocol during bolus administration of intravenous contrast. Multiplanar reconstructed images and MIPs were obtained and reviewed to evaluate the vascular anatomy. CONTRAST:  100 ml isovue 370 COMPARISON:  None. FINDINGS: Mild diffuse hepatic steatosis. No focal hepatic abnormalities. Gallbladder normal. Pancreas and spleen are normal. Adrenal glands are normal. Kidneys are normal. No hydronephrosis. Bladder is normal. Reproductive organs are normal. No significant adenopathy. No ascites. Small fat containing right inguinal hernia. Minimal atherosclerotic calcification abdominal aorta. No aortic dilatation or dissection. Normal opacification of the abdominal aorta as well  as of its major branches. Appendix is not identified. There is no evidence of appendicitis. Large bowel, small bowel, and stomach appear normal. Three vessel coronary artery calcification. Visualized portions of the lung bases are clear. No acute musculoskeletal findings. Review of the MIP images confirms the above findings. IMPRESSION: No acute abnormalities to account for the patient's symptoms. Electronically Signed   By: Esperanza Heir M.D.   On: 07/03/2015 10:20    Microbiology: Recent Results (from the past 240 hour(s))  Urine culture     Status: Abnormal   Collection Time: 07/03/15  8:35 AM  Result Value Ref Range Status   Specimen Description URINE, RANDOM  Final   Special Requests NONE  Final   Culture MULTIPLE SPECIES PRESENT, SUGGEST RECOLLECTION (A)  Final   Report Status 07/05/2015 FINAL  Final  MRSA PCR Screening     Status: None   Collection Time: 07/04/15  1:59 AM  Result Value Ref Range Status   MRSA by PCR NEGATIVE NEGATIVE Final    Comment:        The GeneXpert MRSA Assay (FDA approved for NASAL specimens only), is one component of a comprehensive MRSA colonization surveillance program. It is not intended to diagnose MRSA infection nor to guide or monitor treatment for MRSA infections.      Labs: Basic  Metabolic Panel:  Recent Labs Lab 07/03/15 1123 07/03/15 1936 07/04/15 0451 07/05/15 0831 07/06/15 0247  NA 137 139 138 138 140  K 4.6 4.4 4.2 3.2* 4.0  CL 109 109 107 98* 104  CO2 9* 16* 20* 28 25  GLUCOSE 128* 194* 180* 152* 112*  BUN 17 15 13 13 11   CREATININE 1.12 1.29* 1.04 1.01 0.97  CALCIUM 9.1 9.4 8.9 8.5* 8.7*  PHOS  --  3.7  --  2.6  --    Liver Function Tests:  Recent Labs Lab 07/02/15 2205 07/03/15 0550 07/03/15 1936 07/04/15 0451 07/05/15 0831  AST 35 26  --  27  --   ALT 42 34  --  34  --   ALKPHOS 72 55  --  58  --   BILITOT 0.8 0.7  --  0.8  --   PROT 7.1 6.7  --  6.6  --   ALBUMIN 3.9 3.5 3.5 3.6 3.1*    Recent  Labs Lab 07/02/15 2205  LIPASE 43   No results for input(s): AMMONIA in the last 168 hours. CBC:  Recent Labs Lab 07/02/15 2205 07/03/15 1936 07/04/15 0451  WBC 8.7 8.1 7.4  HGB 17.6* 15.8 16.1  HCT 52.3* 48.7 47.8  MCV 93.1 93.8 91.9  PLT 247 192 172   Cardiac Enzymes:  Recent Labs Lab 07/03/15 0550  CKTOTAL 142   BNP: BNP (last 3 results) No results for input(s): BNP in the last 8760 hours.  ProBNP (last 3 results) No results for input(s): PROBNP in the last 8760 hours.  CBG:  Recent Labs Lab 07/05/15 1644 07/05/15 2010 07/06/15 0849 07/06/15 1247 07/06/15 1734  GLUCAP 178* 233* 147* 138* 102*       Signed:  Valinda Fedie MD.  [facility] 07/07/2015, 8:15 AM

## 2017-12-13 IMAGING — DX DG CHEST 2V
2 series · 2 of 2 positions shown · non-contrast
Comparison: None.

CLINICAL DATA: Chest pain

EXAM:
CHEST  2 VIEW

[chest pa]
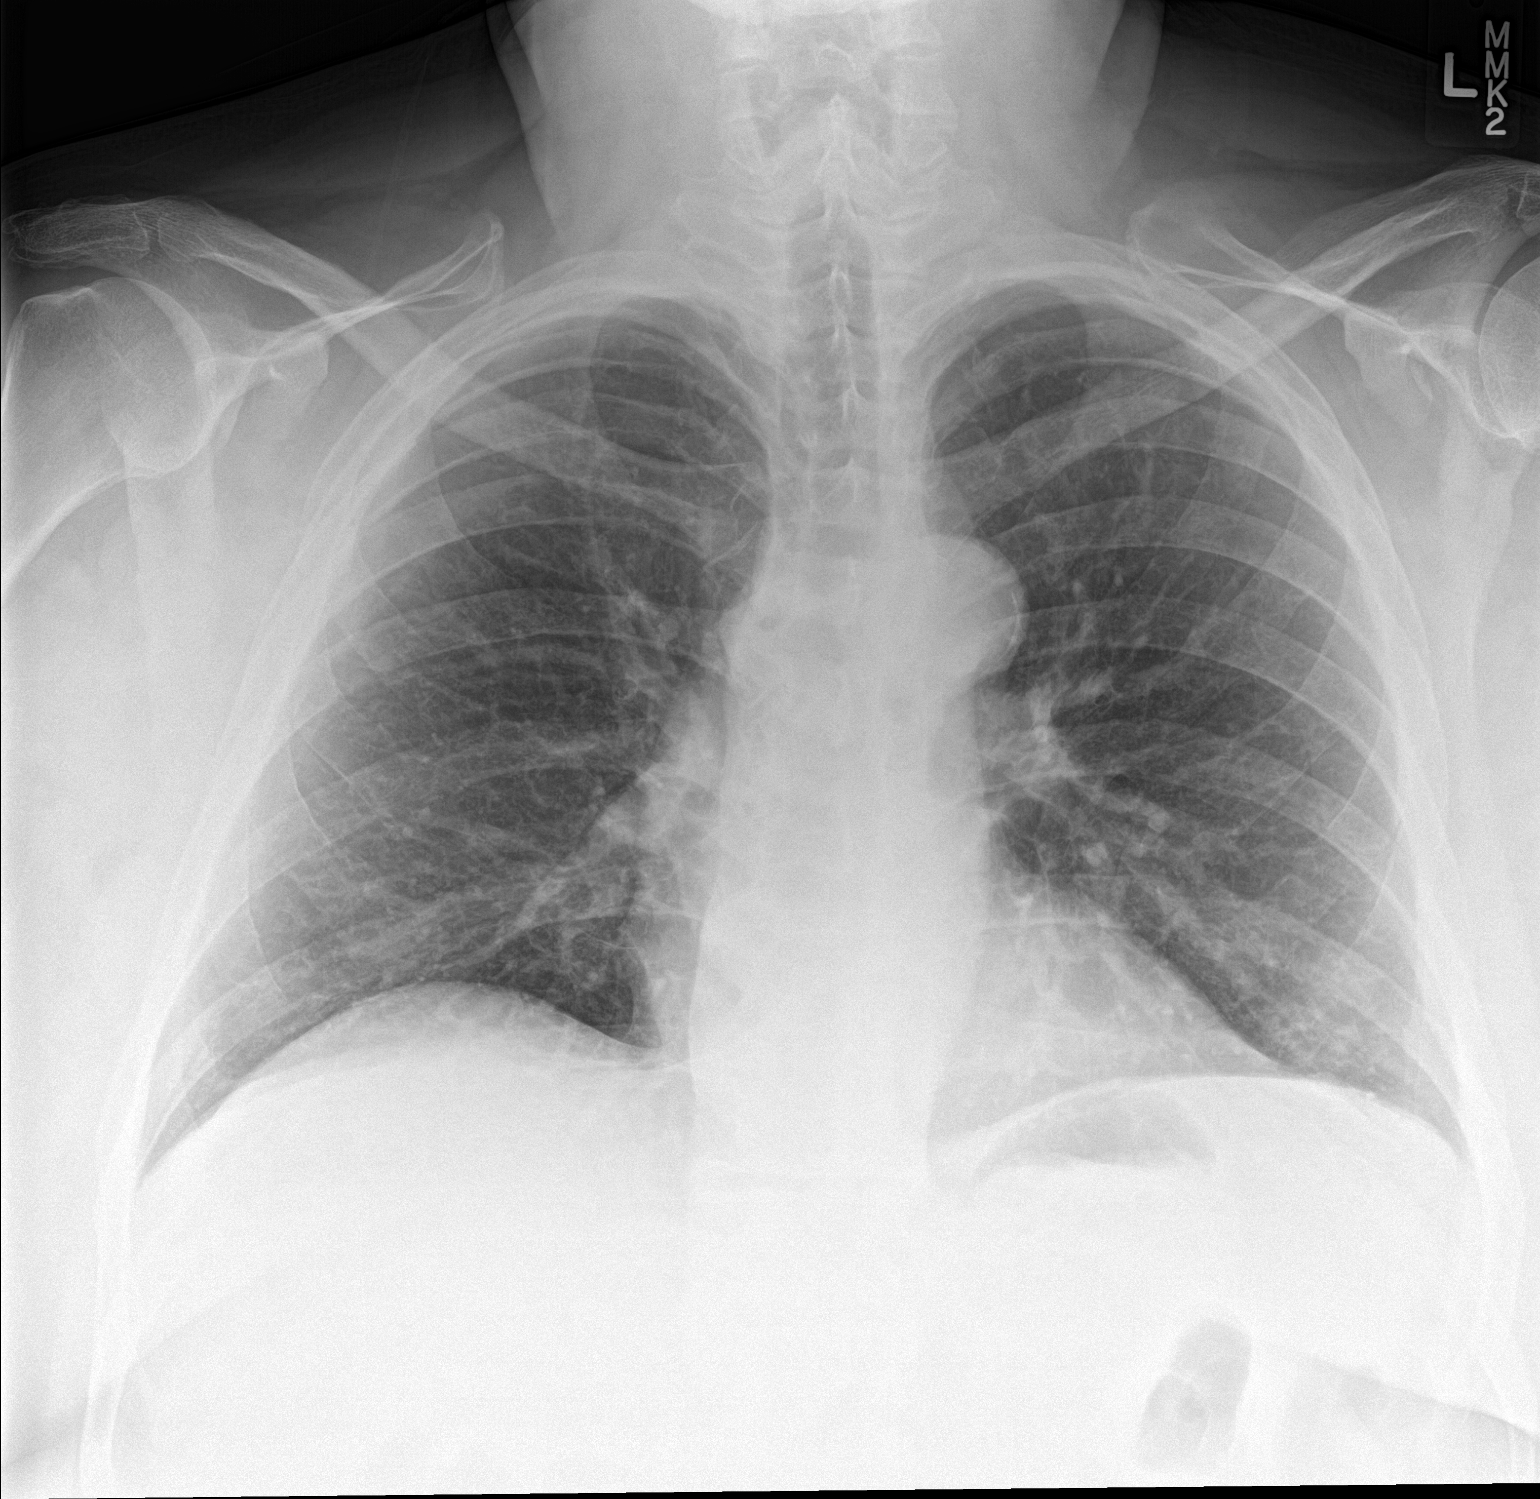

[chest lat]
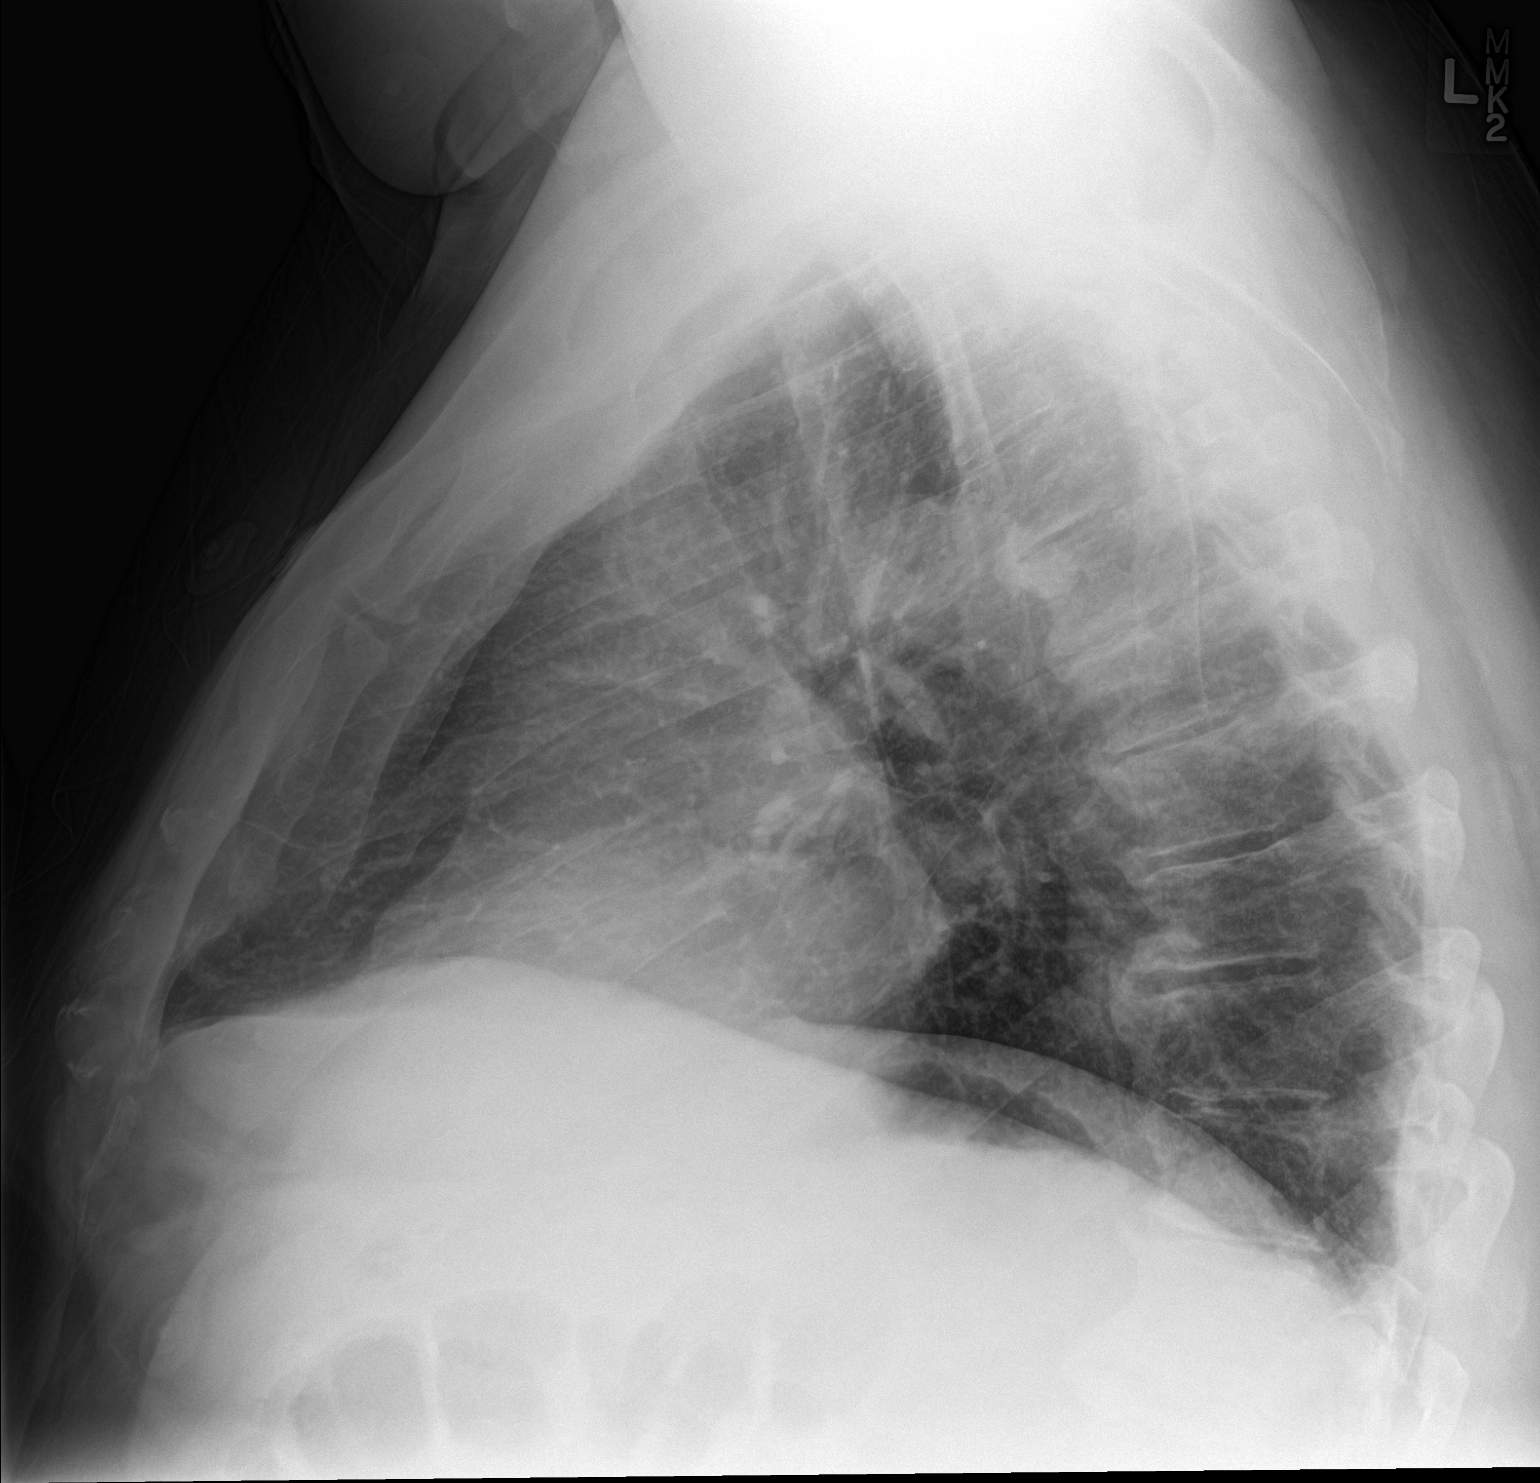

[2 of 2 positions shown; findings below may reference images not displayed]

FINDINGS: Normal heart size. Minimally tortuous atherosclerotic thoracic
aorta. Otherwise normal mediastinal contour. No pneumothorax. No
pleural effusion. Lungs appear clear, with no acute consolidative
airspace disease and no pulmonary edema.
IMPRESSION: No active cardiopulmonary disease.

## 2020-10-13 ENCOUNTER — Other Ambulatory Visit: Payer: Self-pay

## 2020-10-13 ENCOUNTER — Emergency Department (HOSPITAL_COMMUNITY)
Admission: EM | Admit: 2020-10-13 | Discharge: 2020-10-13 | Disposition: A | Discharge status: Home / Self Care | Payer: Medicare Other | Attending: Emergency Medicine | Admitting: Emergency Medicine

## 2020-10-13 ENCOUNTER — Encounter (HOSPITAL_COMMUNITY): Payer: Self-pay | Admitting: Emergency Medicine

## 2020-10-13 ENCOUNTER — Emergency Department (HOSPITAL_COMMUNITY): Payer: Medicare Other

## 2020-10-13 DIAGNOSIS — R55 Syncope and collapse: Secondary | ICD-10-CM | POA: Diagnosis not present

## 2020-10-13 DIAGNOSIS — Z794 Long term (current) use of insulin: Secondary | ICD-10-CM | POA: Insufficient documentation

## 2020-10-13 DIAGNOSIS — I10 Essential (primary) hypertension: Secondary | ICD-10-CM | POA: Diagnosis not present

## 2020-10-13 DIAGNOSIS — E119 Type 2 diabetes mellitus without complications: Secondary | ICD-10-CM | POA: Insufficient documentation

## 2020-10-13 DIAGNOSIS — Z79899 Other long term (current) drug therapy: Secondary | ICD-10-CM | POA: Insufficient documentation

## 2020-10-13 DIAGNOSIS — R2243 Localized swelling, mass and lump, lower limb, bilateral: Secondary | ICD-10-CM | POA: Diagnosis not present

## 2020-10-13 DIAGNOSIS — R079 Chest pain, unspecified: Secondary | ICD-10-CM | POA: Insufficient documentation

## 2020-10-13 LAB — COMPREHENSIVE METABOLIC PANEL
ALT: 23 U/L (ref 0–44)
AST: 60 U/L — ABNORMAL HIGH (ref 15–41)
Albumin: 3.6 g/dL (ref 3.5–5.0)
Alkaline Phosphatase: 157 U/L — ABNORMAL HIGH (ref 38–126)
Anion gap: 9 (ref 5–15)
BUN: 14 mg/dL (ref 8–23)
CO2: 24 mmol/L (ref 22–32)
Calcium: 9 mg/dL (ref 8.9–10.3)
Chloride: 105 mmol/L (ref 98–111)
Creatinine, Ser: 0.93 mg/dL (ref 0.61–1.24)
GFR, Estimated: >60 mL/min (ref >60)
Glucose, Bld: 103 mg/dL — ABNORMAL HIGH (ref 70–99)
Potassium: 3.9 mmol/L (ref 3.5–5.1)
Sodium: 138 mmol/L (ref 135–145)
Total Bilirubin: 0.5 mg/dL (ref 0.3–1.2)
Total Protein: 8.2 g/dL — ABNORMAL HIGH (ref 6.5–8.1)

## 2020-10-13 LAB — CBC WITH DIFFERENTIAL/PLATELET
Abs Immature Granulocytes: 0.06 K/uL (ref 0.00–0.07)
Basophils Absolute: 0 K/uL (ref 0.0–0.1)
Basophils Relative: 1 %
Eosinophils Absolute: 0.1 K/uL (ref 0.0–0.5)
Eosinophils Relative: 1 %
HCT: 43.8 % (ref 39.0–52.0)
Hemoglobin: 13.1 g/dL (ref 13.0–17.0)
Immature Granulocytes: 1 %
Lymphocytes Relative: 19 %
Lymphs Abs: 1.1 K/uL (ref 0.7–4.0)
MCH: 26.7 pg (ref 26.0–34.0)
MCHC: 29.9 g/dL — ABNORMAL LOW (ref 30.0–36.0)
MCV: 89.4 fL (ref 80.0–100.0)
Monocytes Absolute: 0.5 K/uL (ref 0.1–1.0)
Monocytes Relative: 8 %
Neutro Abs: 4.1 K/uL (ref 1.7–7.7)
Neutrophils Relative %: 70 %
Platelets: 348 K/uL (ref 150–400)
RBC: 4.9 MIL/uL (ref 4.22–5.81)
RDW: 17.9 % — ABNORMAL HIGH (ref 11.5–15.5)
WBC: 5.8 K/uL (ref 4.0–10.5)
nRBC: 0 % (ref 0.0–0.2)

## 2020-10-13 LAB — TROPONIN I (HIGH SENSITIVITY)
Troponin I (High Sensitivity): 7 ng/L (ref <18)
Troponin I (High Sensitivity): 9 ng/L (ref <18)

## 2020-10-13 MED ORDER — SODIUM CHLORIDE 0.9 % IV BOLUS: 500.0000 mL | Freq: Once | INTRAVENOUS | Status: DC

## 2020-10-13 NOTE — ED Triage Notes (Signed)
Pt BIB [facility], c/o left sided chest pain that started 3 hrs ago, that radiates to his left arm. Given 324mg  asa and 1NTG pta. Also c/o back pain, reports hx spine surgery.

## 2020-10-13 NOTE — Progress Notes (Signed)
PIV consult: pt discharged. Cancel consult.

## 2020-10-13 NOTE — Care Management (Signed)
ED RN Care Manager met with patient in [place] who reports on his way to [place] from [place]. Patient does not have anyone to call to pick him up from [place]. N CM arranged for Melburn Popper to [place], awaiting a response from [place].. CM will continue to check, updated RN and  patient that may not get a response until the am.

## 2020-10-13 NOTE — Discharge Instructions (Addendum)
Your testing has been normal, and did not show any specific findings Please return to the emergency department for any severe worsening symptoms Please continue to take your medication exactly as prescribed Please make sure you are drinking enough fluids to make urine that is clear, if it becomes too yellow you need to drink more fluids  Please see your doctor within 48 hours for recheck  Thank you for letting us take care of you today!  Please obtain all of your results from medical records or have your doctors office obtain the results - share them with your doctor - you should be seen at your doctors office in the next 2 days. Call today to arrange your follow up. Take the medications as prescribed. Please review all of the medicines and only take them if you do not have an allergy to them. Please be aware that if you are taking birth control pills, taking other prescriptions, ESPECIALLY ANTIBIOTICS may make the birth control ineffective - if this is the case, either do not engage in sexual activity or use alternative methods of birth control such as condoms until you have finished the medicine and your family doctor says it is OK to restart them. If you are on a blood thinner such as COUMADIN, be aware that any other medicine that you take may cause the coumadin to either work too much, or not enough - you should have your coumadin level rechecked in next 7 days if this is the case.  ?  It is also a possibility that you have an allergic reaction to any of the medicines that you have been prescribed - Everybody reacts differently to medications and while MOST people have no trouble with most medicines, you may have a reaction such as nausea, vomiting, rash, swelling, shortness of breath. If this is the case, please stop taking the medicine immediately and contact your physician.   If you were given a medication in the ED such as percocet, vicodin, or morphine, be aware that these medicines are  sedating and may change your ability to take care of yourself adequately for several hours after being given this medicines - you should not drive or take care of small children if you were given this medicine in the Emergency Department or if you have been prescribed these types of medicines. ?   You should return to the ER IMMEDIATELY if you develop severe or worsening symptoms.

## 2020-10-13 NOTE — ED Provider Notes (Signed)
Emergency Medicine Provider Triage Evaluation Note  Derrick Austin , a 60 y.o. male  was evaluated in triage.  Pt complains of chest pain.  Pain started 3 hours prior.  Pain has been constant since then.  Pain is located to the left side of his chest and radiates to left arm.  Pain is described as a "achy pressure."  Endorses associated shortness of breath.  Denies any diaphoresis, nausea, vomiting.  Pain is worse with movement.  Patient denies any improvement after receiving aspirin and nitroglycerin.  Patient also complains of low back pain.  States that pain has been intermittent over the last year.  Reports history of spinal surgery.  Patient denies any tobacco use history.  Review of Systems  Positive: Chest pain, shortness of breath, back pain Negative: Diaphoresis, nausea, vomiting, abdominal pain  Physical Exam  BP (!) 144/99   Pulse (!) 102   Temp 98.7 F (37.1 C)   Resp 16   SpO2 99%  Gen:   Awake, no distress   Resp:  Normal effort, lungs clear to auscultation bilaterally MSK:   Moves extremities without difficulty, no midline tenderness or deformity to cervical, thoracic, lumbar spine.  Patient has tenderness to bilateral lumbar paraspinous muscles Other:  Abdomen soft, nondistended, nontender, no mass, no pulsatile mass, no guarding, no rebound tenderness.  Medical Decision Making  Medically screening exam initiated at 3:42 PM.  Appropriate orders placed.  Derrick Austin was informed that the remainder of the evaluation will be completed by another provider, this initial triage assessment does not replace that evaluation, and the importance of remaining in the ED until their evaluation is complete.  The patient appears stable so that the remainder of the work up may be completed by another provider.      Haskel Schroeder, PA-C 10/13/20 1544    Pollyann Savoy, MD 10/13/20 [phone]

## 2020-10-13 NOTE — ED Provider Notes (Signed)
[facility] EMERGENCY DEPARTMENT Provider Note   CSN: [id number] Arrival date & time: 10/13/20  1529     History Chief Complaint  Patient presents with   Chest Pain    Derrick Austin is a 60 y.o. male.   Chest Pain  This 60 year old male, has a history of multiple medical problems including hypertension, hyperlipidemia, diabetes, he has a known history of prior congestive heart failure and had a [place] Jude pacemaker placed approximately 2 years ago, he states this was done down in [place].  He reports that he is recently traveling from [place] to [place] where he was originally from and states that he has been in [place] now for several days on his way through, he has no way to get back to [place].  He reports that he was walking in the hot sun today.  As he was walking around he became increasingly fatigued and eventually sat down, while he was sitting in the grass he had a syncopal episode.  When he came around because he was feeling fatigued he called 911.  The patient was brought to the hospital, he had a normal sinus rhythm with occasional PVCs on the monitor, he had waited in the waiting room for approximately 6 hours during which time he had multiple labs done including 2 troponins neither of which were elevated to show any signs of ischemia.  He reports no chest pain at this time, he was having some back heaviness earlier but that is gone away as well.  He has some chronic swelling of his legs which seems to be a little bit worse today.  He states that he was recently admitted to the hospital in [place] after having a syncopal event, he reports at that time they evaluated his pacemaker, they evaluated him from head to toe and had multiple different tests on him none of which showed any definitive evidence of abnormality and he was ultimately discharged home.  Past Medical History:  Diagnosis Date   Depression, major, in partial remission (HCC)     Diabetes mellitus without complication (HCC)    H/O ethylene glycol poisoning    Hyperlipidemia    Hypertension    Previous known suicide attempt     Patient Active Problem List   Diagnosis Date Noted   Metabolic acidosis, increased anion gap 07/03/2015   Continuous RLQ abdominal pain 07/03/2015   Diabetes mellitus type II, uncontrolled (HCC) 07/03/2015   Morbid obesity with BMI of 40.0-44.9, adult (HCC) 07/03/2015   HTN (hypertension) 07/03/2015   Bilateral inguinal hernia 07/03/2015   Constipation 07/03/2015   Depression 07/03/2015   Ethylene glycol poisoning    Encounter for central line care    Metabolic acidosis     Past Surgical History:  Procedure Laterality Date   ANKLE SURGERY     APPENDECTOMY     EYE SURGERY     HAMMER TOE SURGERY     both great toes   KNEE SURGERY     TOE AMPUTATION         No family history on file.  Social History   Tobacco Use   Smoking status: Never   Smokeless tobacco: Never  Substance Use Topics   Alcohol use: No   Drug use: No    Home Medications Prior to Admission medications   Medication Sig Start Date End Date Taking? Authorizing Provider  insulin aspart (NOVOLOG) 100 UNIT/ML injection Inject 0-15 Units into the skin 3 (three) times daily with meals.  07/06/15   Kathlen Mody, MD  insulin detemir (LEVEMIR) 100 UNIT/ML injection Inject 0.08 mLs (8 Units total) into the skin daily. 07/06/15   Kathlen Mody, MD  lisinopril-hydrochlorothiazide (PRINZIDE,ZESTORETIC) 20-25 MG tablet Take 1 tablet by mouth daily. 08/26/13   [provider]  loratadine (CLARITIN) 10 MG tablet Take 10 mg by mouth daily.    [provider]  Multiple Vitamin (MULTIVITAMIN WITH MINERALS) TABS tablet Take 1 tablet by mouth daily.    [provider]  sertraline (ZOLOFT) 50 MG tablet Take 150 mg by mouth daily.    [provider]  simvastatin (ZOCOR) 20 MG tablet Take 20 mg by mouth daily. 08/31/14   [provider]     Allergies    Bactrim [sulfamethoxazole-trimethoprim], Penicillins, and Keflex [cephalexin]  Review of Systems   Review of Systems  Cardiovascular:  Positive for chest pain.  All other systems reviewed and are negative.  Physical Exam Updated Vital Signs BP (!) 154/98   Pulse 85   Temp 98.7 F (37.1 C)   Resp (!) 34   SpO2 100%   Physical Exam Vitals and nursing note reviewed.  Constitutional:      General: He is not in acute distress.    Appearance: He is well-developed.  HENT:     Head: Normocephalic and atraumatic.     Mouth/Throat:     Pharynx: No oropharyngeal exudate.  Eyes:     General: No scleral icterus.       Right eye: No discharge.        Left eye: No discharge.     Conjunctiva/sclera: Conjunctivae normal.     Pupils: Pupils are equal, round, and reactive to light.  Neck:     Thyroid: No thyromegaly.     Vascular: No JVD.  Cardiovascular:     Rate and Rhythm: Normal rate and regular rhythm.     Heart sounds: Normal heart sounds. No murmur heard.   No friction rub. No gallop.  Pulmonary:     Effort: Pulmonary effort is normal. No respiratory distress.     Breath sounds: Normal breath sounds. No wheezing or rales.  Abdominal:     General: Bowel sounds are normal. There is no distension.     Palpations: Abdomen is soft. There is no mass.     Tenderness: There is no abdominal tenderness.  Musculoskeletal:        General: No tenderness. Normal range of motion.     Cervical back: Normal range of motion and neck supple.     Right lower leg: No tenderness. Edema present.     Left lower leg: No tenderness. No edema.  Lymphadenopathy:     Cervical: No cervical adenopathy.  Skin:    General: Skin is warm and dry.     Findings: No erythema or rash.  Neurological:     Mental Status: He is alert.     Coordination: Coordination normal.  Psychiatric:        Behavior: Behavior normal.    ED Results / Procedures / Treatments   Labs (all labs ordered are  listed, but only abnormal results are displayed) Labs Reviewed  CBC WITH DIFFERENTIAL/PLATELET - Abnormal; Notable for the following components:      Result Value   MCHC 29.9 (*)    RDW 17.9 (*)    All other components within normal limits  COMPREHENSIVE METABOLIC PANEL - Abnormal; Notable for the following components:   Glucose, Bld 103 (*)  Total Protein 8.2 (*)    AST 60 (*)    Alkaline Phosphatase 157 (*)    All other components within normal limits  TROPONIN I (HIGH SENSITIVITY)  TROPONIN I (HIGH SENSITIVITY)    EKG EKG Interpretation  Date/Time:  Thursday October 13 2020 15:35:10 EDT Ventricular Rate:  102 PR Interval:    QRS Duration: 78 QT Interval:  392 QTC Calculation: 510 R Axis:   -31 Text Interpretation: Accelerated Junctional rhythm with occasional Premature ventricular complexes Left axis deviation Minimal voltage criteria for LVH, may be normal variant ( R in aVL ) Septal infarct , age undetermined Abnormal ECG Confirmed by Eber Hong ([id number]) on 10/13/2020 9:34:11 PM  Radiology DG Chest 2 View  Result Date: 10/13/2020 CLINICAL DATA:  Chest pain EXAM: CHEST - 2 VIEW COMPARISON:  Radiograph 07/03/2015 FINDINGS: Unchanged cardiomediastinal silhouette. There is a single lead pacemaker new since 2017. Elevated right hemidiaphragm with subsegmental basilar atelectasis. No focal airspace consolidation. Partially visualized thoracolumbar spine fusion hardware. No acute osseous abnormality. IMPRESSION: Elevated right hemidiaphragm with right basilar subsegmental atelectasis. Electronically Signed   By: Caprice Renshaw M.D.   On: 10/13/2020 16:28    Procedures Procedures   Medications Ordered in ED Medications  sodium chloride 0.9 % bolus 500 mL (has no administration in time range)    ED Course  I have reviewed the triage vital signs and the nursing notes.  Pertinent labs & imaging results that were available during my care of the patient were reviewed by me and  considered in my medical decision making (see chart for details).    MDM Rules/Calculators/A&P                           This patient has had a unremarkable chest x-ray, his EKG was unremarkable, his lab work thus far has been unremarkable as well.  He has vital signs which are reassuring with a blood pressure of 154 systolic, heart rate of 85, his oxygen level is 100%.  He does not appear to be fluid overloaded, he has no signs of pulmonary edema.  The patient reports to me that he has had prior leg swelling from time to time in his right leg always swells more than the left leg, this is a chronic finding  The patient is tolerating food and fluid by mouth, his creatinine is 0.9 with a BUN of 14 reflecting that he is not severely dehydrated but given his environmental exposures is increasing and heat exposure I will give him some fluids and have social work talk to him about local shelters.  His pacemaker is working appropriately he has no arrhythmias, the patient is agreeable to the plan  This patient is not tachypneic for me, he is not tachycardic, he has no pulmonary edema, he has been offered food and fluids, social work was able to help find him a cab ride back up into [place] where he wants to go.  I think this is reasonable since he just had a significant work-up for syncope which yielded no answers.  It sounds like this was probably heat exhaustion, the patient is agreeable  Final Clinical Impression(s) / ED Diagnoses Final diagnoses:  Syncope, unspecified syncope type     Eber Hong, MD 10/13/20 2254

## 2023-01-04 DEATH — deceased

## 2023-04-29 IMAGING — CR DG CHEST 2V
2 series · 2 of 2 positions shown · non-contrast
Comparison: Radiograph 07/03/2015

CLINICAL DATA: Chest pain

EXAM:
CHEST - 2 VIEW

[chest lat]
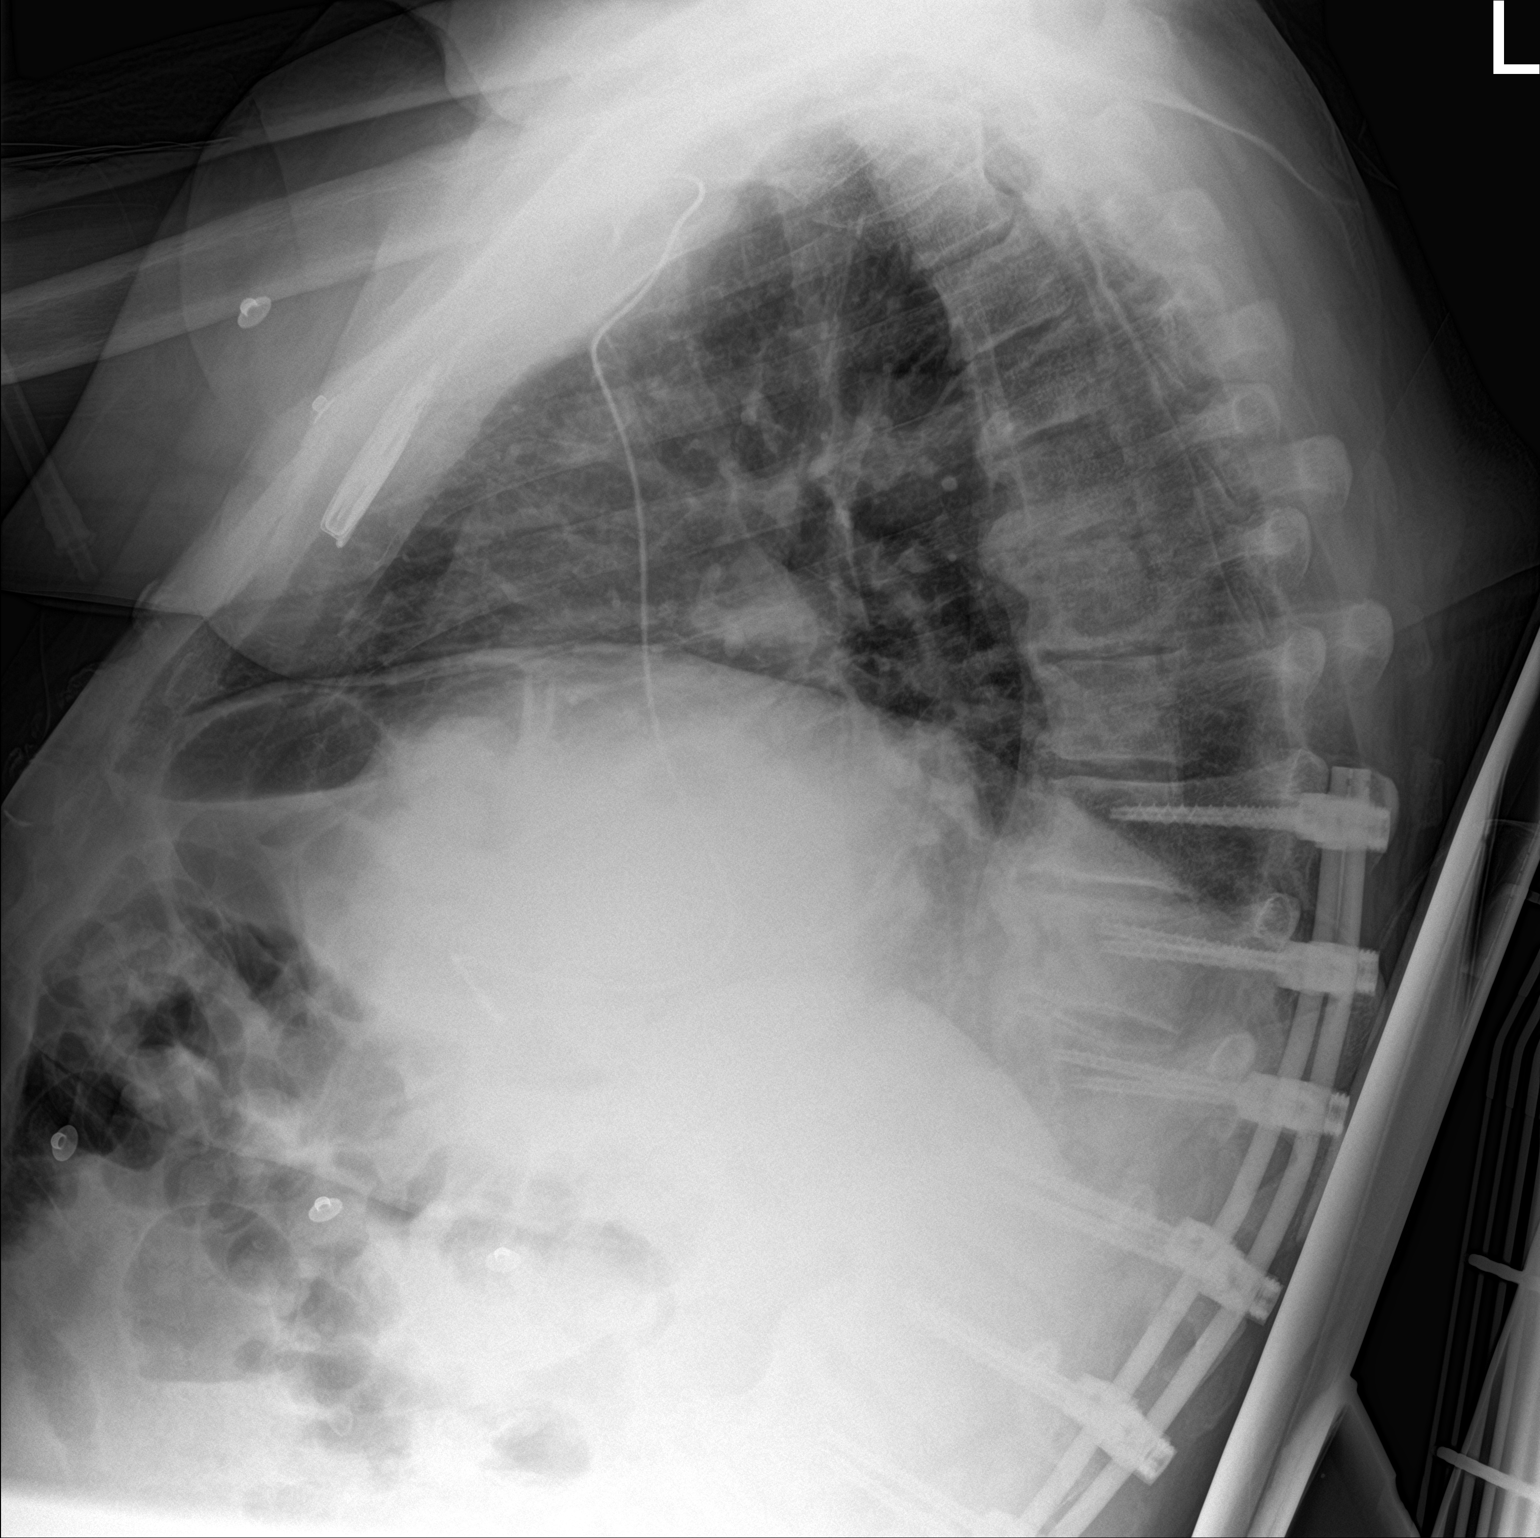

[chest ap]
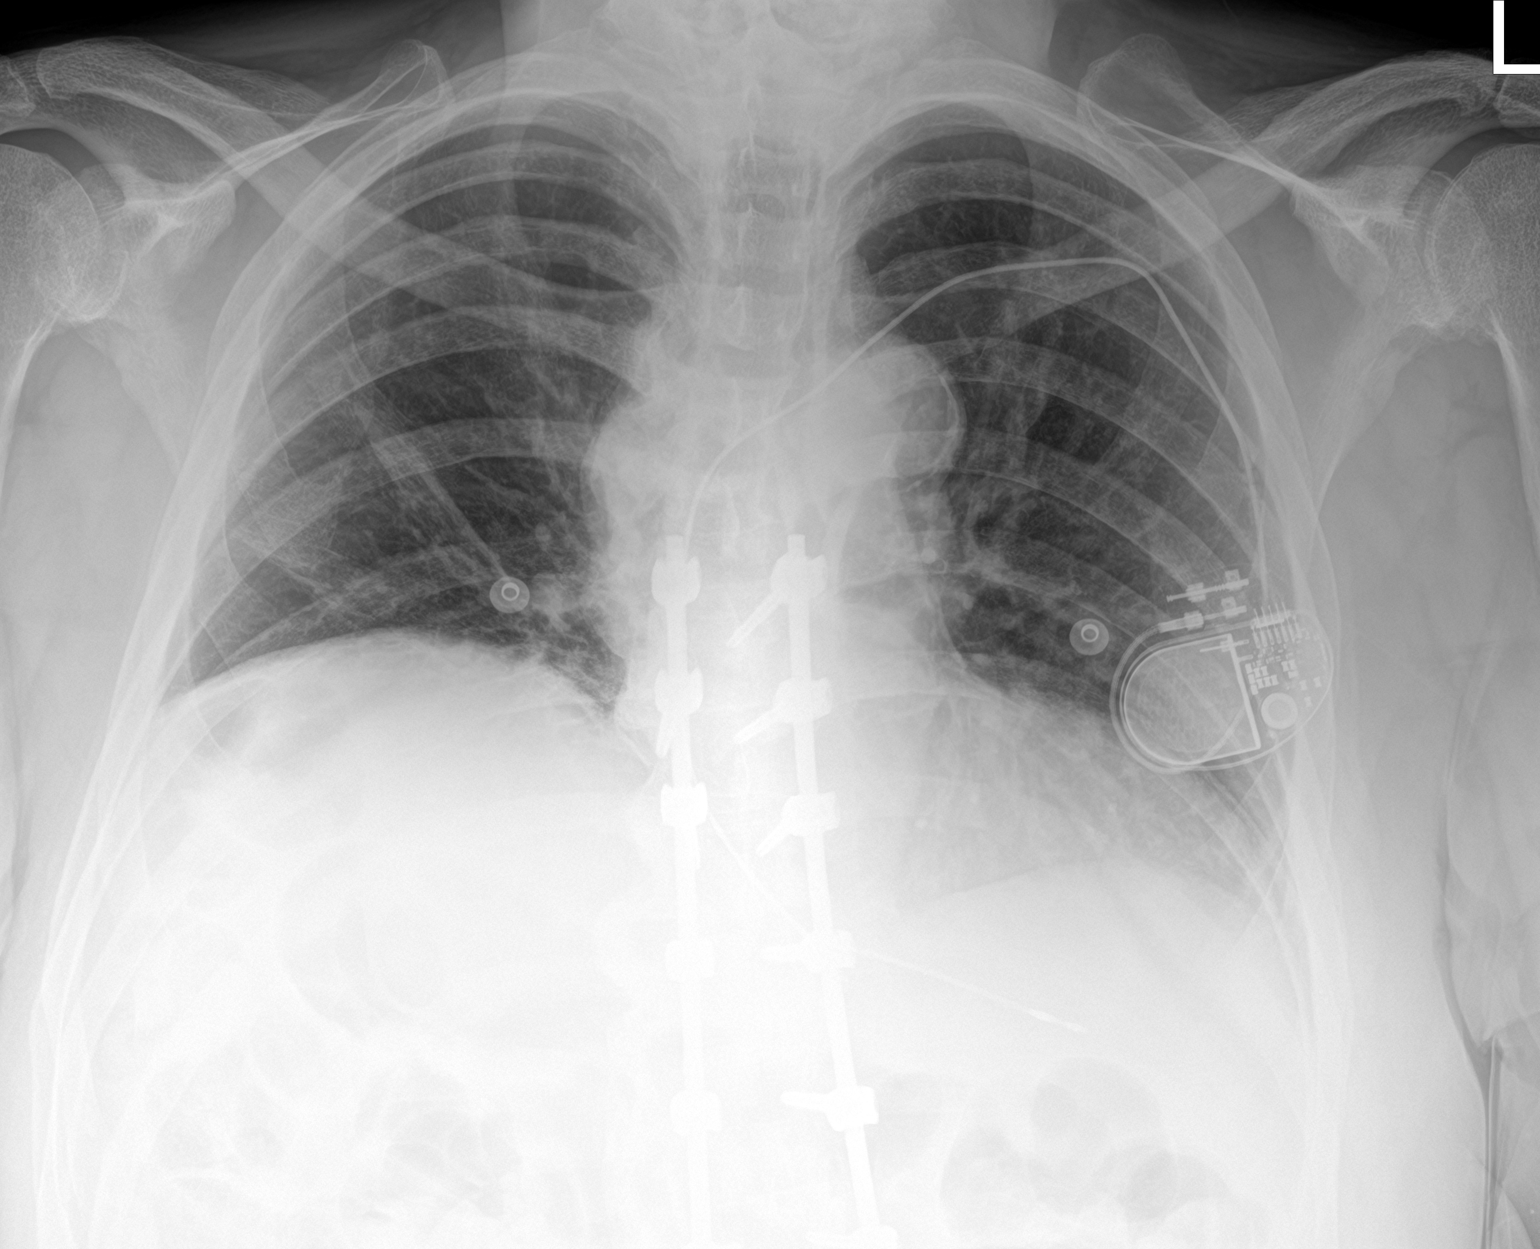

[2 of 2 positions shown; findings below may reference images not displayed]

FINDINGS: Unchanged cardiomediastinal silhouette. There is a single lead
pacemaker new since 0470. Elevated right hemidiaphragm with
subsegmental basilar atelectasis. No focal airspace consolidation.
Partially visualized thoracolumbar spine fusion hardware. No acute
osseous abnormality.
IMPRESSION: Elevated right hemidiaphragm with right basilar subsegmental
atelectasis.
